# Patient Record
Sex: Female | Born: 1950 | Race: Black or African American | Hispanic: No | Marital: Married | Smoking: Former smoker
Health system: Southern US, Community
[De-identification: ages and names within clinical notes are randomized; demographics above are authoritative.]

## PROBLEM LIST (undated history)

## (undated) DIAGNOSIS — Z8679 Personal history of other diseases of the circulatory system: Secondary | ICD-10-CM

## (undated) DIAGNOSIS — R011 Cardiac murmur, unspecified: Secondary | ICD-10-CM

## (undated) DIAGNOSIS — E7800 Pure hypercholesterolemia, unspecified: Secondary | ICD-10-CM

## (undated) DIAGNOSIS — J40 Bronchitis, not specified as acute or chronic: Secondary | ICD-10-CM

## (undated) DIAGNOSIS — D72819 Decreased white blood cell count, unspecified: Secondary | ICD-10-CM

## (undated) DIAGNOSIS — M546 Pain in thoracic spine: Secondary | ICD-10-CM

## (undated) DIAGNOSIS — K5900 Constipation, unspecified: Secondary | ICD-10-CM

## (undated) DIAGNOSIS — R252 Cramp and spasm: Secondary | ICD-10-CM

## (undated) DIAGNOSIS — M479 Spondylosis, unspecified: Secondary | ICD-10-CM

## (undated) DIAGNOSIS — Z8701 Personal history of pneumonia (recurrent): Secondary | ICD-10-CM

## (undated) HISTORY — DX: Spondylosis, unspecified: M47.9

## (undated) HISTORY — DX: Cardiac murmur, unspecified: R01.1

## (undated) HISTORY — DX: Pure hypercholesterolemia, unspecified: E78.00

## (undated) HISTORY — DX: Personal history of other diseases of the circulatory system: Z86.79

## (undated) HISTORY — DX: Personal history of pneumonia (recurrent): Z87.01

## (undated) HISTORY — DX: Pain in thoracic spine: M54.6

## (undated) HISTORY — DX: Constipation, unspecified: K59.00

## (undated) HISTORY — DX: Cramp and spasm: R25.2

## (undated) HISTORY — DX: Decreased white blood cell count, unspecified: D72.819

## (undated) HISTORY — DX: Bronchitis, not specified as acute or chronic: J40

---

## 2001-01-04 ENCOUNTER — Ambulatory Visit (HOSPITAL_COMMUNITY): Admission: RE | Admit: 2001-01-04 | Discharge: 2001-01-04 | Payer: Self-pay | Admitting: Internal Medicine

## 2001-02-01 ENCOUNTER — Other Ambulatory Visit: Admission: RE | Admit: 2001-02-01 | Discharge: 2001-02-01 | Payer: Self-pay | Admitting: Obstetrics & Gynecology

## 2001-07-04 ENCOUNTER — Encounter: Payer: Self-pay | Admitting: Emergency Medicine

## 2001-07-04 ENCOUNTER — Emergency Department (HOSPITAL_COMMUNITY): Admission: EM | Admit: 2001-07-04 | Discharge: 2001-07-05 | Payer: Self-pay | Admitting: Emergency Medicine

## 2002-06-25 ENCOUNTER — Other Ambulatory Visit: Admission: RE | Admit: 2002-06-25 | Discharge: 2002-06-25 | Payer: Self-pay | Admitting: Obstetrics & Gynecology

## 2002-08-18 ENCOUNTER — Emergency Department (HOSPITAL_COMMUNITY): Admission: EM | Admit: 2002-08-18 | Discharge: 2002-08-18 | Payer: Self-pay | Admitting: *Deleted

## 2003-07-28 ENCOUNTER — Other Ambulatory Visit: Admission: RE | Admit: 2003-07-28 | Discharge: 2003-07-28 | Payer: Self-pay | Admitting: Obstetrics & Gynecology

## 2003-09-28 ENCOUNTER — Other Ambulatory Visit: Admission: RE | Admit: 2003-09-28 | Discharge: 2003-09-28 | Payer: Self-pay | Admitting: Obstetrics & Gynecology

## 2004-08-26 ENCOUNTER — Ambulatory Visit (HOSPITAL_COMMUNITY): Admission: RE | Admit: 2004-08-26 | Discharge: 2004-08-26 | Payer: Self-pay | Admitting: Internal Medicine

## 2007-02-02 ENCOUNTER — Ambulatory Visit (HOSPITAL_COMMUNITY): Admission: RE | Admit: 2007-02-02 | Discharge: 2007-02-02 | Payer: Self-pay | Admitting: Family Medicine

## 2007-02-09 ENCOUNTER — Ambulatory Visit (HOSPITAL_COMMUNITY): Admission: RE | Admit: 2007-02-09 | Discharge: 2007-02-09 | Payer: Self-pay | Admitting: Family Medicine

## 2007-03-14 ENCOUNTER — Encounter (HOSPITAL_COMMUNITY): Admission: RE | Admit: 2007-03-14 | Discharge: 2007-04-19 | Payer: Self-pay | Admitting: Neurosurgery

## 2007-06-04 ENCOUNTER — Emergency Department (HOSPITAL_COMMUNITY): Admission: EM | Admit: 2007-06-04 | Discharge: 2007-06-04 | Payer: Self-pay | Admitting: Emergency Medicine

## 2007-12-20 ENCOUNTER — Emergency Department (HOSPITAL_COMMUNITY): Admission: EM | Admit: 2007-12-20 | Discharge: 2007-12-20 | Payer: Self-pay | Admitting: Emergency Medicine

## 2008-09-04 ENCOUNTER — Other Ambulatory Visit: Admission: RE | Admit: 2008-09-04 | Discharge: 2008-09-04 | Payer: Self-pay | Admitting: Obstetrics and Gynecology

## 2008-09-08 ENCOUNTER — Ambulatory Visit (HOSPITAL_COMMUNITY): Admission: RE | Admit: 2008-09-08 | Discharge: 2008-09-08 | Payer: Self-pay | Admitting: Obstetrics & Gynecology

## 2008-09-17 ENCOUNTER — Ambulatory Visit (HOSPITAL_COMMUNITY): Admission: RE | Admit: 2008-09-17 | Discharge: 2008-09-17 | Payer: Self-pay | Admitting: Obstetrics & Gynecology

## 2009-04-29 HISTORY — PX: COLONOSCOPY W/ BIOPSIES AND POLYPECTOMY: SHX1376

## 2009-05-20 ENCOUNTER — Ambulatory Visit: Payer: Self-pay | Admitting: Gastroenterology

## 2009-05-21 ENCOUNTER — Encounter: Payer: Self-pay | Admitting: Gastroenterology

## 2009-05-26 ENCOUNTER — Ambulatory Visit (HOSPITAL_COMMUNITY): Admission: RE | Admit: 2009-05-26 | Discharge: 2009-05-26 | Payer: Self-pay | Admitting: Gastroenterology

## 2009-05-26 ENCOUNTER — Ambulatory Visit: Payer: Self-pay | Admitting: Gastroenterology

## 2009-05-26 ENCOUNTER — Encounter: Payer: Self-pay | Admitting: Gastroenterology

## 2009-10-18 ENCOUNTER — Ambulatory Visit: Payer: Self-pay | Admitting: Cardiology

## 2009-10-18 ENCOUNTER — Inpatient Hospital Stay (HOSPITAL_COMMUNITY): Admission: EM | Admit: 2009-10-18 | Discharge: 2009-10-19 | Payer: Self-pay | Admitting: Emergency Medicine

## 2009-10-27 ENCOUNTER — Ambulatory Visit: Payer: Self-pay | Admitting: Cardiology

## 2009-10-27 ENCOUNTER — Ambulatory Visit (HOSPITAL_COMMUNITY): Admission: RE | Admit: 2009-10-27 | Discharge: 2009-10-27 | Payer: Self-pay | Admitting: Cardiology

## 2009-10-27 ENCOUNTER — Encounter: Payer: Self-pay | Admitting: Cardiology

## 2009-11-05 ENCOUNTER — Ambulatory Visit (HOSPITAL_COMMUNITY): Admission: RE | Admit: 2009-11-05 | Discharge: 2009-11-05 | Payer: Self-pay | Admitting: Obstetrics & Gynecology

## 2009-12-08 ENCOUNTER — Encounter (HOSPITAL_COMMUNITY): Admission: RE | Admit: 2009-12-08 | Discharge: 2010-01-07 | Payer: Self-pay | Admitting: Oncology

## 2009-12-08 ENCOUNTER — Ambulatory Visit (HOSPITAL_COMMUNITY): Payer: Self-pay | Admitting: Oncology

## 2009-12-09 ENCOUNTER — Other Ambulatory Visit: Admission: RE | Admit: 2009-12-09 | Discharge: 2009-12-09 | Payer: Self-pay | Admitting: Obstetrics and Gynecology
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
## 2010-03-12 ENCOUNTER — Ambulatory Visit (HOSPITAL_COMMUNITY): Payer: Self-pay | Admitting: Oncology

## 2010-03-12 ENCOUNTER — Encounter (HOSPITAL_COMMUNITY): Admission: RE | Admit: 2010-03-12 | Discharge: 2010-04-11 | Payer: Self-pay | Admitting: Oncology

## 2010-06-11 ENCOUNTER — Encounter (HOSPITAL_COMMUNITY)
Admission: RE | Admit: 2010-06-11 | Discharge: 2010-07-11 | Payer: Self-pay | Source: Home / Self Care | Admitting: Oncology

## 2010-06-11 ENCOUNTER — Ambulatory Visit (HOSPITAL_COMMUNITY): Payer: Self-pay | Admitting: Oncology

## 2010-09-19 ENCOUNTER — Encounter (HOSPITAL_COMMUNITY): Payer: Self-pay | Admitting: Oncology

## 2010-11-10 LAB — CBC
HCT: 38.7 % (ref 36.0–46.0)
Hemoglobin: 12.9 g/dL (ref 12.0–15.0)
MCV: 86.4 fL (ref 78.0–100.0)
WBC: 3.9 10*3/uL — ABNORMAL LOW (ref 4.0–10.5)

## 2010-11-10 LAB — DIFFERENTIAL
Basophils Relative: 1 % (ref 0–1)
Eosinophils Absolute: 0.1 10*3/uL (ref 0.0–0.7)
Eosinophils Relative: 3 % (ref 0–5)
Lymphs Abs: 2.5 10*3/uL (ref 0.7–4.0)
Monocytes Absolute: 0.4 10*3/uL (ref 0.1–1.0)
Monocytes Relative: 10 % (ref 3–12)
Neutro Abs: 0.8 10*3/uL — ABNORMAL LOW (ref 1.7–7.7)

## 2010-11-13 LAB — DIFFERENTIAL
Eosinophils Relative: 2 % (ref 0–5)
Lymphocytes Relative: 65 % — ABNORMAL HIGH (ref 12–46)
Monocytes Absolute: 0.3 10*3/uL (ref 0.1–1.0)
Monocytes Relative: 10 % (ref 3–12)
Neutro Abs: 0.7 10*3/uL — ABNORMAL LOW (ref 1.7–7.7)
Neutrophils Relative %: 22 % — ABNORMAL LOW (ref 43–77)

## 2010-11-13 LAB — CBC
HCT: 40.4 % (ref 36.0–46.0)
MCHC: 33.3 g/dL (ref 30.0–36.0)
Platelets: 227 10*3/uL (ref 150–400)
WBC: 3 10*3/uL — ABNORMAL LOW (ref 4.0–10.5)

## 2010-11-17 LAB — CARDIAC PANEL(CRET KIN+CKTOT+MB+TROPI)
CK, MB: 3.2 ng/mL (ref 0.3–4.0)
Relative Index: 2.6 — ABNORMAL HIGH (ref 0.0–2.5)
Relative Index: 2.8 — ABNORMAL HIGH (ref 0.0–2.5)
Troponin I: 0.01 ng/mL (ref 0.00–0.06)

## 2010-11-17 LAB — CBC
HCT: 36.2 % (ref 36.0–46.0)
HCT: 41.9 % (ref 36.0–46.0)
HCT: 39.1 % (ref 36.0–46.0)
Hemoglobin: 12 g/dL (ref 12.0–15.0)
Hemoglobin: 13.3 g/dL (ref 12.0–15.0)
MCV: 87.6 fL (ref 78.0–100.0)
MCV: 85.9 fL (ref 78.0–100.0)
Platelets: 211 10*3/uL (ref 150–400)
Platelets: 229 10*3/uL (ref 150–400)
Platelets: 226 10*3/uL (ref 150–400)
RBC: 4.13 MIL/uL (ref 3.87–5.11)
RDW: 13.4 % (ref 11.5–15.5)
WBC: 4.5 10*3/uL (ref 4.0–10.5)
WBC: 5.6 10*3/uL (ref 4.0–10.5)

## 2010-11-17 LAB — DIFFERENTIAL
Basophils Absolute: 0 10*3/uL (ref 0.0–0.1)
Basophils Relative: 1 % (ref 0–1)
Basophils Relative: 1 % (ref 0–1)
Eosinophils Absolute: 0.1 10*3/uL (ref 0.0–0.7)
Eosinophils Absolute: 0.1 10*3/uL (ref 0.0–0.7)
Eosinophils Relative: 2 % (ref 0–5)
Eosinophils Relative: 2 % (ref 0–5)
Lymphocytes Relative: 55 % — ABNORMAL HIGH (ref 12–46)
Lymphocytes Relative: 62 % — ABNORMAL HIGH (ref 12–46)
Lymphs Abs: 3.1 10*3/uL (ref 0.7–4.0)
Monocytes Absolute: 0.4 10*3/uL (ref 0.1–1.0)
Monocytes Absolute: 0.5 10*3/uL (ref 0.1–1.0)
Monocytes Relative: 10 % (ref 3–12)
Monocytes Relative: 7 % (ref 3–12)
Neutro Abs: 0.8 10*3/uL — ABNORMAL LOW (ref 1.7–7.7)
Neutro Abs: 1 10*3/uL — ABNORMAL LOW (ref 1.7–7.7)
Neutrophils Relative %: 26 % — ABNORMAL LOW (ref 43–77)

## 2010-11-17 LAB — BASIC METABOLIC PANEL
BUN: 17 mg/dL (ref 6–23)
BUN: 18 mg/dL (ref 6–23)
CO2: 25 mEq/L (ref 19–32)
Chloride: 107 mEq/L (ref 96–112)
Creatinine, Ser: 0.71 mg/dL (ref 0.4–1.2)
GFR calc Af Amer: >60 mL/min (ref >60)
GFR calc non Af Amer: >60 mL/min (ref >60)
Potassium: 3.6 mEq/L (ref 3.5–5.1)
Potassium: 4.1 mEq/L (ref 3.5–5.1)
Sodium: 136 mEq/L (ref 135–145)
Sodium: 141 mEq/L (ref 135–145)

## 2010-11-17 LAB — HEPATIC FUNCTION PANEL
ALT: 22 U/L (ref 0–35)
AST: 21 U/L (ref 0–37)
Albumin: 3.8 g/dL (ref 3.5–5.2)
Bilirubin, Direct: 0.1 mg/dL (ref 0.0–0.3)
Indirect Bilirubin: 0.5 mg/dL (ref 0.3–0.9)
Total Bilirubin: 0.6 mg/dL (ref 0.3–1.2)

## 2010-11-17 LAB — POCT CARDIAC MARKERS
CKMB, poc: 2 ng/mL (ref 1.0–8.0)
Troponin i, poc: <0.05 ng/mL (ref 0.00–0.09)

## 2010-11-17 LAB — ANA: Anti Nuclear Antibody(ANA): NEGATIVE

## 2010-11-17 LAB — VITAMIN B12: Vitamin B-12: 1184 pg/mL — ABNORMAL HIGH (ref 211–911)

## 2010-11-17 LAB — PROTIME-INR: INR: 1.08 (ref 0.00–1.49)

## 2010-11-17 LAB — RHEUMATOID FACTOR: Rhuematoid fact SerPl-aCnc: <20 IU/mL (ref 0–20)

## 2010-11-17 LAB — COMPREHENSIVE METABOLIC PANEL
Alkaline Phosphatase: 67 U/L (ref 39–117)
BUN: 15 mg/dL (ref 6–23)
Creatinine, Ser: 0.69 mg/dL (ref 0.4–1.2)
Glucose, Bld: 85 mg/dL (ref 70–99)
Potassium: 3.8 mEq/L (ref 3.5–5.1)
Total Bilirubin: 0.3 mg/dL (ref 0.3–1.2)
Total Protein: 7.1 g/dL (ref 6.0–8.3)

## 2010-11-17 LAB — B. BURGDORFI ANTIBODIES: B burgdorferi Ab IgG+IgM: 0.07 {ISR}

## 2010-12-16 ENCOUNTER — Other Ambulatory Visit: Payer: Self-pay | Admitting: Obstetrics & Gynecology

## 2010-12-16 DIAGNOSIS — Z139 Encounter for screening, unspecified: Secondary | ICD-10-CM

## 2010-12-23 ENCOUNTER — Ambulatory Visit (HOSPITAL_COMMUNITY)
Admission: RE | Admit: 2010-12-23 | Discharge: 2010-12-23 | Disposition: A | Discharge status: Home / Self Care | Payer: BC Managed Care – PPO | Source: Ambulatory Visit | Attending: Obstetrics & Gynecology | Admitting: Obstetrics & Gynecology

## 2010-12-23 ENCOUNTER — Ambulatory Visit (HOSPITAL_COMMUNITY): Payer: Self-pay

## 2010-12-23 DIAGNOSIS — Z1231 Encounter for screening mammogram for malignant neoplasm of breast: Secondary | ICD-10-CM | POA: Insufficient documentation

## 2010-12-23 DIAGNOSIS — Z139 Encounter for screening, unspecified: Secondary | ICD-10-CM

## 2011-01-24 ENCOUNTER — Emergency Department (HOSPITAL_COMMUNITY)
Admission: EM | Admit: 2011-01-24 | Discharge: 2011-01-24 | Disposition: A | Discharge status: Home / Self Care | Payer: BC Managed Care – PPO | Attending: Emergency Medicine | Admitting: Emergency Medicine

## 2011-01-24 DIAGNOSIS — L0100 Impetigo, unspecified: Secondary | ICD-10-CM | POA: Insufficient documentation

## 2011-01-24 DIAGNOSIS — E7800 Pure hypercholesterolemia, unspecified: Secondary | ICD-10-CM | POA: Insufficient documentation

## 2011-01-24 DIAGNOSIS — R51 Headache: Secondary | ICD-10-CM | POA: Insufficient documentation

## 2011-06-02 ENCOUNTER — Other Ambulatory Visit: Payer: Self-pay | Admitting: Adult Health

## 2011-06-02 ENCOUNTER — Other Ambulatory Visit (HOSPITAL_COMMUNITY)
Admission: RE | Admit: 2011-06-02 | Discharge: 2011-06-02 | Disposition: A | Discharge status: Home / Self Care | Payer: BC Managed Care – PPO | Source: Ambulatory Visit | Attending: Obstetrics and Gynecology | Admitting: Obstetrics and Gynecology

## 2011-06-02 DIAGNOSIS — Z01419 Encounter for gynecological examination (general) (routine) without abnormal findings: Secondary | ICD-10-CM | POA: Insufficient documentation

## 2011-06-10 ENCOUNTER — Encounter (HOSPITAL_COMMUNITY): Payer: BC Managed Care – PPO | Attending: Oncology

## 2011-06-10 DIAGNOSIS — D72819 Decreased white blood cell count, unspecified: Secondary | ICD-10-CM

## 2011-06-10 LAB — DIFFERENTIAL
Basophils Absolute: 0 10*3/uL (ref 0.0–0.1)
Eosinophils Absolute: 0.1 10*3/uL (ref 0.0–0.7)
Lymphs Abs: 2.7 10*3/uL (ref 0.7–4.0)
Monocytes Absolute: 0.4 10*3/uL (ref 0.1–1.0)

## 2011-06-10 LAB — CBC
MCH: 28.2 pg (ref 26.0–34.0)
MCHC: 32.9 g/dL (ref 30.0–36.0)
MCV: 85.8 fL (ref 78.0–100.0)
Platelets: 262 10*3/uL (ref 150–400)
RDW: 13.9 % (ref 11.5–15.5)

## 2011-06-10 NOTE — Progress Notes (Signed)
Labs drawn today for cbc,diff 

## 2011-06-13 ENCOUNTER — Encounter (HOSPITAL_BASED_OUTPATIENT_CLINIC_OR_DEPARTMENT_OTHER): Payer: BC Managed Care – PPO

## 2011-06-13 ENCOUNTER — Encounter (HOSPITAL_COMMUNITY): Payer: Self-pay

## 2011-06-13 VITALS — BP 146/85 | HR 78 | Temp 98.4°F | Wt 182.6 lb

## 2011-06-13 DIAGNOSIS — D72819 Decreased white blood cell count, unspecified: Secondary | ICD-10-CM

## 2011-06-13 NOTE — Patient Instructions (Signed)
[facility]  Discharge Instructions  RECOMMENDATIONS MADE BY THE CONSULTANT AND ANY TEST RESULTS WILL BE SENT TO YOUR REFERRING DOCTOR.   EXAM FINDINGS BY MD TODAY AND SIGNS AND SYMPTOMS TO REPORT TO CLINIC OR PRIMARY MD: exam per Dr. Dalene Carrow   INSTRUCTIONS GIVEN AND DISCUSSED: Other  Labs and exam in one year  SPECIAL INSTRUCTIONS/FOLLOW-UP: Other (Referral/Appointments) one year   I acknowledge that I have been informed and understand all the instructions given to me and received a copy. I do not have any more questions at this time, but understand that I may call the Specialty Clinic at [facility] at [phone] during business hours should I have any further questions or need assistance in obtaining follow-up care.    __________________________________________  _____________  __________ Signature of Patient or Authorized Representative            Date                   Time    __________________________________________ Nurse's Signature

## 2011-06-13 NOTE — Progress Notes (Signed)
This office note has been dictated.

## 2011-07-11 NOTE — Progress Notes (Signed)
CC:   Karen Huynh, M.D.  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  INTERVAL HISTORY:  Karen Huynh was last seen a year ago.  Since that time, does not report any upper respiratory tract infections, pneumonias, or skin boils.  She is up-to-date with flu shot.  She works at a nursing home.  Her weight is stable.  She has not noted any adenopathy.  Besides perimenopausal night sweats, denies fever or chills.  ALLERGIES:  None.  MEDICATIONS:  Are Wellbutrin.  REVIEW OF SYSTEMS:  As in nursing intake sheet and essentially negative.  PHYSICAL EXAMINATION:  General:  The patient is a well-appearing, well- nourished woman in no distress.  Vitals:  Pulse 78, blood pressure 146/85, temperature 98.4, respirations 20, weight 189.2 pounds.  HEENT: Head is atraumatic, normocephalic.  Sclerae anicteric.  Mouth moist. Neck:  Supple.  Chest:  Clear.  CVS:  Unremarkable.  Abdomen:  Soft, nontender.  Bowel sounds present.  Extremities:  No edema.  No calf tenderness.  Lymph Nodes:  No adenopathy.  LABORATORY DATA:  CBC obtained 06/10/2011:  White cell count e.7, hemoglobin 13.9, hematocrit 42.3, platelets 262, ANC 500 (800).  IMPRESSION AND PLAN:  Karen Huynh is a pleasant 60 year old woman with a history of mild leukopenia.  Her labs remain stable.  She also has remained clinically stable with no signs of infections.  Thus with this said, we will have her return in a year's time with repeat CBC.  In the interim, if she has any issues or concerns, she is not to hesitate to contact us.    ______________________________ Laurice Record, M.D. LIO/MEDQ  D:  06/13/2011  T:  06/13/2011  Job:  [id number]

## 2012-01-12 ENCOUNTER — Other Ambulatory Visit: Payer: Self-pay | Admitting: Adult Health

## 2012-01-12 DIAGNOSIS — Z139 Encounter for screening, unspecified: Secondary | ICD-10-CM

## 2012-01-19 ENCOUNTER — Ambulatory Visit (HOSPITAL_COMMUNITY)
Admission: RE | Admit: 2012-01-19 | Discharge: 2012-01-19 | Disposition: A | Discharge status: Home / Self Care | Payer: BC Managed Care – PPO | Source: Ambulatory Visit | Attending: Adult Health | Admitting: Adult Health

## 2012-01-19 DIAGNOSIS — Z139 Encounter for screening, unspecified: Secondary | ICD-10-CM

## 2012-01-19 DIAGNOSIS — Z1231 Encounter for screening mammogram for malignant neoplasm of breast: Secondary | ICD-10-CM | POA: Insufficient documentation

## 2012-06-05 ENCOUNTER — Other Ambulatory Visit (HOSPITAL_COMMUNITY)
Admission: RE | Admit: 2012-06-05 | Discharge: 2012-06-05 | Disposition: A | Discharge status: Home / Self Care | Payer: BC Managed Care – PPO | Source: Ambulatory Visit | Attending: Obstetrics and Gynecology | Admitting: Obstetrics and Gynecology

## 2012-06-05 DIAGNOSIS — Z1151 Encounter for screening for human papillomavirus (HPV): Secondary | ICD-10-CM | POA: Insufficient documentation

## 2012-06-05 DIAGNOSIS — Z01419 Encounter for gynecological examination (general) (routine) without abnormal findings: Secondary | ICD-10-CM | POA: Insufficient documentation

## 2012-06-11 ENCOUNTER — Other Ambulatory Visit (HOSPITAL_COMMUNITY): Payer: BC Managed Care – PPO

## 2012-06-13 ENCOUNTER — Encounter (HOSPITAL_COMMUNITY): Payer: BC Managed Care – PPO | Attending: Oncology

## 2012-06-13 DIAGNOSIS — D72819 Decreased white blood cell count, unspecified: Secondary | ICD-10-CM | POA: Insufficient documentation

## 2012-06-13 DIAGNOSIS — F17200 Nicotine dependence, unspecified, uncomplicated: Secondary | ICD-10-CM | POA: Insufficient documentation

## 2012-06-13 LAB — CBC
HCT: 41 % (ref 36.0–46.0)
MCHC: 32.7 g/dL (ref 30.0–36.0)
Platelets: 249 10*3/uL (ref 150–400)
RDW: 13.8 % (ref 11.5–15.5)

## 2012-06-13 LAB — DIFFERENTIAL
Basophils Absolute: 0 10*3/uL (ref 0.0–0.1)
Basophils Relative: 1 % (ref 0–1)
Monocytes Absolute: 0.4 10*3/uL (ref 0.1–1.0)
Neutro Abs: 0.5 10*3/uL — ABNORMAL LOW (ref 1.7–7.7)
Neutrophils Relative %: 14 % — ABNORMAL LOW (ref 43–77)

## 2012-06-13 NOTE — Progress Notes (Signed)
Labs drawn today for cbc/diff 

## 2012-06-18 ENCOUNTER — Encounter (HOSPITAL_BASED_OUTPATIENT_CLINIC_OR_DEPARTMENT_OTHER): Payer: BC Managed Care – PPO | Admitting: Oncology

## 2012-06-18 ENCOUNTER — Encounter (HOSPITAL_COMMUNITY): Payer: Self-pay | Admitting: Oncology

## 2012-06-18 VITALS — BP 137/80 | HR 70 | Temp 98.0°F | Resp 16 | Wt 183.6 lb

## 2012-06-18 DIAGNOSIS — F17200 Nicotine dependence, unspecified, uncomplicated: Secondary | ICD-10-CM

## 2012-06-18 DIAGNOSIS — D72819 Decreased white blood cell count, unspecified: Secondary | ICD-10-CM

## 2012-06-18 NOTE — Progress Notes (Signed)
Problem #1 leukopenia times several years. She has had no change in her white cell count. Her only 2 normal white counts however were at a time when she was admitted with a possible diagnosis of pericarditis in 2011. She has no increased use of antibiotics, frequency of sinusitis or bronchitis or pneumonia etc. Unfortunately she still smoking about 4 cigarettes per day and I have asked her one more time to these quit. The most she has smoked is one half pack of cigarettes a day starting around age 6. She has no other symptoms of vasculitis. Her workup in the past for this leukopenia is negative. 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 Her review of systems is otherwise noncontributory  She is up-to-date on her mammography and her colonoscopy. Her mother did die of colon cancer  Vital signs are stable. Lungs are clear. She has no lymphadenopathy. She has no hepatosplenomegaly. Heart shows a regular rhythm and rate without murmur rub or gallop. Bowel sounds are normal. She has no leg edema arm edema and no skin lesions.  She looks very good we'll see her back in one year. I will consider checking an ANCA antibody test that. I suspect it will be negative.

## 2012-06-18 NOTE — Patient Instructions (Addendum)
[facility]  Discharge Instructions  RECOMMENDATIONS MADE BY THE CONSULTANT AND ANY TEST RESULTS WILL BE SENT TO YOUR REFERRING DOCTOR.   EXAM FINDINGS BY MD TODAY AND SIGNS AND SYMPTOMS TO REPORT TO CLINIC OR PRIMARY MD: exam and discussion by MD.  Your blood work is stable.  We will check a special blood test when you come back in 1 year.  Try to stop smoking.  MEDICATIONS PRESCRIBED: none   INSTRUCTIONS GIVEN AND DISCUSSED: Other :  Report any recurring infections,night sweats, etc.  SPECIAL INSTRUCTIONS/FOLLOW-UP: Lab work Needed in 1 year and Return to Clinic after labs in 1 year.   I acknowledge that I have been informed and understand all the instructions given to me and received a copy. I do not have any more questions at this time, but understand that I may call the Specialty Clinic at [facility] at [phone] during business hours should I have any further questions or need assistance in obtaining follow-up care.    __________________________________________  _____________  __________ Signature of Patient or Authorized Representative            Date                   Time    __________________________________________ Nurse's Signature

## 2012-10-30 ENCOUNTER — Other Ambulatory Visit (HOSPITAL_COMMUNITY): Payer: Self-pay | Admitting: Family Medicine

## 2012-10-30 DIAGNOSIS — M5430 Sciatica, unspecified side: Secondary | ICD-10-CM

## 2012-10-30 DIAGNOSIS — Z139 Encounter for screening, unspecified: Secondary | ICD-10-CM

## 2012-11-05 ENCOUNTER — Ambulatory Visit (HOSPITAL_COMMUNITY)
Admission: RE | Admit: 2012-11-05 | Discharge: 2012-11-05 | Disposition: A | Discharge status: Home / Self Care | Payer: BC Managed Care – PPO | Source: Ambulatory Visit | Attending: Family Medicine | Admitting: Family Medicine

## 2012-11-05 DIAGNOSIS — M5430 Sciatica, unspecified side: Secondary | ICD-10-CM

## 2012-11-05 DIAGNOSIS — Z139 Encounter for screening, unspecified: Secondary | ICD-10-CM

## 2012-11-05 DIAGNOSIS — Z780 Asymptomatic menopausal state: Secondary | ICD-10-CM | POA: Insufficient documentation

## 2012-11-05 DIAGNOSIS — M899 Disorder of bone, unspecified: Secondary | ICD-10-CM | POA: Insufficient documentation

## 2012-11-27 ENCOUNTER — Ambulatory Visit (HOSPITAL_COMMUNITY)
Admission: RE | Admit: 2012-11-27 | Discharge: 2012-11-27 | Disposition: A | Discharge status: Home / Self Care | Payer: BC Managed Care – PPO | Source: Ambulatory Visit | Attending: Family Medicine | Admitting: Family Medicine

## 2012-11-27 DIAGNOSIS — M545 Low back pain, unspecified: Secondary | ICD-10-CM | POA: Insufficient documentation

## 2012-11-27 DIAGNOSIS — M546 Pain in thoracic spine: Secondary | ICD-10-CM | POA: Insufficient documentation

## 2012-11-27 DIAGNOSIS — IMO0001 Reserved for inherently not codable concepts without codable children: Secondary | ICD-10-CM | POA: Insufficient documentation

## 2012-11-27 DIAGNOSIS — M6281 Muscle weakness (generalized): Secondary | ICD-10-CM | POA: Insufficient documentation

## 2012-11-27 DIAGNOSIS — M542 Cervicalgia: Secondary | ICD-10-CM | POA: Insufficient documentation

## 2012-11-28 DIAGNOSIS — M542 Cervicalgia: Secondary | ICD-10-CM | POA: Insufficient documentation

## 2012-11-28 DIAGNOSIS — M545 Low back pain: Secondary | ICD-10-CM | POA: Insufficient documentation

## 2012-11-28 NOTE — Evaluation (Addendum)
Physical Therapy Evaluation  Patient Details  Name: GENELLA BAS MRN: [id number] Date of Birth: November 10, 1950  Today's Date: 11/27/2012 Time: 4098-1191 PT Time Calculation (min): 30 min Charges: 1 eval             Visit#: 1 of 12  Re-eval: 12/27/12 Assessment Diagnosis: LBP Next MD Visit: Dr. Regino Schultze - unscheduled  Past Medical History:  Past Medical History  Diagnosis Date  . H/O: rheumatic fever     as child  . History of pneumonia   . Leukopenia    Past Surgical History: No past surgical history on file.  Subjective Symptoms/Limitations Pertinent History: Pt is referred to PT for LBP with radicular symptoms to LLE with neck and shoulder pain which started back in January.  She reports that she has to sit in a recliner all day for her job and states that her back and shoulders begin to be very painful by the end of the day.  Limitations: Sitting;Lifting;Standing;Walking How long can you sit comfortably?: 2 hours  How long can you stand comfortably?: an hour, with her church shoes 30 minutes How long can you walk comfortably?: no difficulty.  Patient Stated Goals: "Get rid of this pain." Pain Assessment Currently in Pain?: Yes Pain Score:   7 Pain Location: Back (shoulder region and low back down left leg) Pain Orientation: Left Pain Type: Acute pain;Chronic pain Pain Onset: More than a month ago Pain Frequency: Constant Pain Relieving Factors: getting up and moving around.  Effect of Pain on Daily Activities: difficulty getting comfortable  Balance Screening Balance Screen Has the patient fallen in the past 6 months: No Has the patient had a decrease in activity level because of a fear of falling? : No Is the patient reluctant to leave their home because of a fear of falling? 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  Requires sitting most of  her day.   Sensation/Coordination/Flexibility/Functional Tests Coordination Gross Motor Movements are Fluid and Coordinated: No Coordination and Movement Description: impaired to transverse, multifidus and pelvic floor.  Functional Tests Functional Tests: Oswestry Disability Index (ODI): 56%  Assessment RLE Strength Right Hip Flexion: 3+/5 Right Hip Extension: 3+/5 Right Hip ABduction: 3+/5 Right Knee Flexion: 4/5 Right Knee Extension: 4/5 LLE Strength Left Hip Flexion: 3+/5 Left Hip Extension: 3+/5 Left Hip ABduction: 3+/5 Left Knee Flexion: 4/5 Left Knee Extension: 4/5 Lumbar AROM Lumbar Flexion: WNL Lumbar Extension: WNL Lumbar - Right Side Bend: WNL Lumbar - Left Side Bend: WNL Palpation Palpation: pain and tenderness to lumbar region.  Significant fascial restriction to cervical region   Mobility/Balance  Posture/Postural Control Posture/Postural Control: Postural limitations Postural Limitations: upper cross and lower cross sydnrome   Exercise/Treatments Standing Other Standing Lumbar Exercises: Back Bends x10 Seated Other Seated Lumbar Exercises: Pelvic Floor 1x10 sec holds, TrA 2x10 sec holds, anterior tile 2x10 sec holds Other Seated Lumbar Exercises: Heel and toe roll outs 2x10 sec holds  Physical Therapy Assessment and Plan PT Assessment and Plan Clinical Impression Statement: Pt is a 62 year old female referred to PT for LBP with following impairments listed below.  At this time educated pt on exercises she can perform while at her job. Pt has moderatly decreased pain, especially after back bends.  Pt will benefit from skilled therapeutic intervention in order to improve on the following deficits: Pain;Decreased coordination;Decreased strength;Increased fascial restricitons;Increased muscle spasms Rehab  Potential: Good PT Frequency: Min 3X/week PT Duration: 4 weeks PT Treatment/Interventions: Gait training;Stair training;Functional mobility  training;Therapeutic activities;Therapeutic exercise;Balance training;Neuromuscular re-education;Patient/family education;Manual techniques;Modalities PT Plan: Continue with core exercises and prone activities to decrease sciatic pain, incorportate nerve glides when able.  Continue to progress to LE strengthening (squats, heel/toe raises) 4 way SLR.     Goals Home Exercise Program Pt will Perform Home Exercise Program: Independently PT Goal: Perform Home Exercise Program - Progress: Goal set today PT Short Term Goals Time to Complete Short Term Goals: 2 weeks PT Short Term Goal 1: Pt will report 75% decrease in Lt leg radicular symptoms.  PT Short Term Goal 2: Pt will improve her core coordination and strength in order to sit with appriororiate posture for 20 mintues.  PT Long Term Goals Time to Complete Long Term Goals: 4 weeks PT Long Term Goal 1: Pt will improve her ODI to less than 25% for improved QOL.  PT Long Term Goal 2: Pt will improve her core strength and activity tolerance to [facility] in order to tolerate sitting for greater than 2 hours for her job duties.  Long Term Goal 3: Pt will present with decreased fascial restrictions to scapular and lower back region in order to improve QOL.  Problem List Patient Active Problem List  Diagnosis  . Lumbago  . Cervicalgia    PT Plan of Care PT Home Exercise Plan: see scanned report PT Patient Instructions: importance of posture, discussed activities while sitting in a char, answered questions on POC.  Consulted and Agree with Plan of Care: Patient   Annett Fabian, PT 11/28/2012, 5:38 PM  Physician Documentation Your signature is required to indicate approval of the treatment plan as stated above.  Please sign and either send electronically or make a copy of this report for your files and return this physician signed original.   Please mark one 1.__approve of plan  2. ___approve of plan with the following  conditions.   ______________________________                                                          _____________________ Physician Signature                                                                                                             Date

## 2012-12-05 ENCOUNTER — Ambulatory Visit (HOSPITAL_COMMUNITY)
Admission: RE | Admit: 2012-12-05 | Discharge: 2012-12-05 | Disposition: A | Discharge status: Home / Self Care | Payer: BC Managed Care – PPO | Source: Ambulatory Visit | Attending: Family Medicine | Admitting: Family Medicine

## 2012-12-05 NOTE — Progress Notes (Signed)
Physical Therapy Treatment Patient Details  Name: Karen Huynh MRN: [id number] Date of Birth: [birth year]  Today's Date: 12/05/2012 Time: 8295-6213 PT Time Calculation (min): 41 min Charge: Therex 38'  Visit#: 2 of 12  Re-eval: 12/27/12   Subjective: Symptoms/Limitations Symptoms: Pt reported cervical, thoracic and lumbar pain scale 8/10 today,  Pt compliant with HEP. Pain Assessment Currently in Pain?: Yes Pain Score:   8 Pain Location: Back Pain Orientation: Upper;Mid;Lower  Objective:   Exercise/Treatments Seated Other Seated Lumbar Exercises: Pelvic Floor 10x10 sec holds, TrA 10x10 sec holds, anterior tile 2x10 sec holds Other Seated Lumbar Exercises: Heel and toe roll outs 10x10 sec holds Supine Straight Leg Raise: 10 reps Sidelying Hip Abduction: 10 reps;Limitations Hip Abduction Limitations: Bil LE Prone  Straight Leg Raise: 10 reps Other Prone Lumbar Exercises: Multifidus 10x 10" with tactile cueing  Physical Therapy Assessment and Plan PT Assessment and Plan Clinical Impression Statement: Progressed core and LE strengthening exercises with tactile cueing for correct musculature activation for multifidus exercise. Pt able to complete therex with min difficulty cueing required for form with prone SLR for correct musculature activation.  Pt reported LBP resolved with multifidus exercise, pt given printout to add exercise to HEP.Marland Kitchen  No c/o radicular pain through session. PT Plan: Continue with core exercises and prone activities to decrease sciatic pain, incorportate nerve glides when able.  Continue to progress to LE strengthening, next session begin squats and heel/toe raises    Goals    Problem List Patient Active Problem List  Diagnosis  . Lumbago  . Cervicalgia    PT - End of Session Activity Tolerance: Patient tolerated treatment well General Behavior During Session: [facility] for tasks performed Cognition: [facility] for tasks performed  GP    Juel Burrow 12/05/2012, 3:49 PM

## 2012-12-07 ENCOUNTER — Ambulatory Visit (HOSPITAL_COMMUNITY)
Admission: RE | Admit: 2012-12-07 | Discharge: 2012-12-07 | Disposition: A | Discharge status: Home / Self Care | Payer: BC Managed Care – PPO | Source: Ambulatory Visit | Attending: Family Medicine | Admitting: Family Medicine

## 2012-12-07 NOTE — Progress Notes (Signed)
Physical Therapy Treatment Patient Details  Name: Karen Huynh MRN: [id number] Date of Birth: 1950-11-12  Today's Date: 12/07/2012 Time: 7829-5621 PT Time Calculation (min): 40 min Charge: Therex 38'  Visit#: 3 of 12  Re-eval: 12/27/12    Subjective: Symptoms/Limitations Symptoms: Pt reported pain 6/10 mid back at beginning of session.  Reported compliance with multifidus and backbend exercises.   Pain Assessment Currently in Pain?: Yes Pain Score:   6 Pain Location: Back Pain Orientation: Mid  Objective:   Exercise/Treatments Standing Heel Raises: 15 reps;Limitations Heel Raises Limitations: toe raises  Functional Squats: 10 reps Seated Other Seated Lumbar Exercises: Pelvic Floor 10x10 sec holds, TrA 10x10 sec holds, anterior tilt 2x10 sec holds Other Seated Lumbar Exercises: Anterior/posterior rotation on green ball Supine Clam: 5 reps Bent Knee Raise: 5 reps Straight Leg Raise: 10 reps Prone  Straight Leg Raise: 15 reps Other Prone Lumbar Exercises: Multifidus 15x 10" with tactile cueing Other Prone Lumbar Exercises: heel squeeze 10x 5"  Physical Therapy Assessment and Plan PT Assessment and Plan Clinical Impression Statement: Pt improving TrA activaiion with less tactile and vc-ing required each rep.  Added squats for LE strengtheing, pt able to verbalize mechnaics for proper positioning with min cueing required.  Pt reported pain resolved at end of session, encouraged to increase frequency of HEP for maximum benefits.   PT Plan: Continue with core exercises and prone activities to decrease sciatic pain, incorportate nerve glides when able.  Continue to progress to LE strengthening.    Goals    Problem List Patient Active Problem List  Diagnosis  . Lumbago  . Cervicalgia    PT - End of Session Activity Tolerance: Patient tolerated treatment well General Behavior During Session: [name] for tasks performed Cognition: [name] for tasks performed  GP     Karen Huynh 12/07/2012, 4:05 PM

## 2012-12-11 ENCOUNTER — Ambulatory Visit (HOSPITAL_COMMUNITY): Payer: BC Managed Care – PPO | Admitting: Physical Therapy

## 2012-12-13 ENCOUNTER — Ambulatory Visit (HOSPITAL_COMMUNITY)
Admission: RE | Admit: 2012-12-13 | Discharge: 2012-12-13 | Disposition: A | Discharge status: Home / Self Care | Payer: BC Managed Care – PPO | Source: Ambulatory Visit | Attending: Family Medicine | Admitting: Family Medicine

## 2012-12-13 NOTE — Progress Notes (Signed)
Physical Therapy Treatment Patient Details  Name: Karen Huynh MRN: [id number] Date of Birth: 18-Feb-1951  Today's Date: 12/13/2012 Time: 4098-1191 PT Time Calculation (min): 38 min Charges: 30' Manual, 8' TE Visit#: 4 of 12  Re-eval: 12/27/12    Subjective: Symptoms/Limitations Symptoms: Pt reports that her lt leg has radicular pain today.  She continues to have neck pain.  States she is able to do some of her exercises during her time at work when she is sitting.  Pain Assessment Currently in Pain?: Yes Pain Score:   6 Pain Location: Back  Precautions/Restrictions     Exercise/Treatments Stretches Upper Trapezius Stretch: 2 reps;30 seconds (Bil) Levator Stretch: 2 reps;30 seconds Supine Bridge: 15 reps  Manual Therapy Manual Therapy: Myofascial release Myofascial Release: Prone: to Lt upper trapezius with soft tissue massage after to decrease pain and fascial restrictions x30 minutes  Physical Therapy Assessment and Plan PT Assessment and Plan Clinical Impression Statement: manual techniques provided to Lt upper trapezius  PT Plan: Focus on cervical pain.  Add UBE, theraband activities next visit    Goals    Problem List Patient Active Problem List  Diagnosis  . Lumbago  . Cervicalgia    PT - End of Session Activity Tolerance: Patient tolerated treatment well General Cognition: WFL for tasks performed PT Plan of Care PT Home Exercise Plan: updated with bridges and cervical stretches PT Patient Instructions: importance of decreasing weight to 12.5lb purse Consulted and Agree with Plan of Care: Patient  GP    Courtne Lighty 12/13/2012, 3:25 PM

## 2012-12-18 ENCOUNTER — Ambulatory Visit (HOSPITAL_COMMUNITY)
Admission: RE | Admit: 2012-12-18 | Discharge: 2012-12-18 | Disposition: A | Discharge status: Home / Self Care | Payer: BC Managed Care – PPO | Source: Ambulatory Visit | Attending: Family Medicine | Admitting: Family Medicine

## 2012-12-18 NOTE — Progress Notes (Signed)
Physical Therapy Treatment Patient Details  Name: Karen Huynh MRN: [id number] Date of Birth: 28-Oct-1950  Today's Date: 12/18/2012 Time: 1478-2956 PT Time Calculation (min): 45 min  Visit#: 5 of 12  Re-eval: 12/27/12 Charges: Therex x 20' Manual x 10' MHP x 10    Subjective: Symptoms/Limitations Symptoms: Pt states that she cleaned out her pocketbook to make it lighter.  Pain Assessment Currently in Pain?: Yes Pain Score:   6 Pain Location: Neck Pain Orientation: Right;Left   Exercise/Treatments Stretches Upper Trapezius Stretch: 2 reps;30 seconds Levator Stretch: 2 reps;30 seconds Seated Exercises Neck Retraction: 10 reps W Back: 10 reps Shoulder Shrugs: 10 reps  Modalities Modalities: Moist Heat Manual Therapy Manual Therapy: Myofascial release Myofascial Release: upper trapezius with soft tissue massage to decrease pain and fascial restrictions Moist Heat Therapy Number Minutes Moist Heat: 10 Minutes Moist Heat Location: Other (comment) (Cervical)  Physical Therapy Assessment and Plan PT Assessment and Plan Clinical Impression Statement: Tx focus on improving posture, ROM and decreasing pain. Manual techniques completed to upper trapezius to decrease pain and tightness. MHP applied at end of session to decrease tightness in cervical area. Pt reports pain decrease to 0/10 at end of session.  PT Plan: Continue to progress postural strength, ROM and decrease pain/tightness per PT POC.     Problem List Patient Active Problem List  Diagnosis  . Lumbago  . Cervicalgia    PT - End of Session Activity Tolerance: Patient tolerated treatment well General Behavior During Therapy: WFL for tasks assessed/performed Cognition: WFL for tasks performed  Seth Bake, PTA  12/18/2012, 3:52 PM

## 2012-12-20 ENCOUNTER — Ambulatory Visit (HOSPITAL_COMMUNITY)
Admission: RE | Admit: 2012-12-20 | Discharge: 2012-12-20 | Disposition: A | Discharge status: Home / Self Care | Payer: BC Managed Care – PPO | Source: Ambulatory Visit | Attending: Family Medicine | Admitting: Family Medicine

## 2012-12-20 NOTE — Progress Notes (Signed)
Physical Therapy Treatment Patient Details  Name: Karen Huynh MRN: [id number] Date of Birth: 03/22/1951  Today's Date: 12/20/2012 Time: 9629-5284 PT Time Calculation (min): 41 min  Visit#: 6 of 12  Re-eval: 12/27/12 Charges: Therex x 28' Manual x 10'   Subjective: Symptoms/Limitations Symptoms: This morining I had an episode with pain going down my L leg but that went away.  I am hurting greater in my left neck than my right but both sides bother me. Pain Assessment Currently in Pain?: Yes Pain Score:   5 Pain Location: Neck Pain Orientation: Right;Left Pain Type: Chronic pain   Exercise/Treatments Theraband Exercises Scapula Retraction: 10 reps;Green Shoulder Extension: 10 reps;Green Rows: 10 reps;Green Standing Exercises Wall Push Ups: 10 reps Upper Extremity Flexion with Stabilization: 10 reps;Flexion Seated Exercises Neck Retraction: 10 reps W Back: 10 reps;Weight W Back Weights (lbs): 2 Shoulder Shrugs: 10 reps  Manual Therapy Myofascial Release: upper trapezius with soft tissue massage to decrease pain and fascial restrictions  Physical Therapy Assessment and Plan PT Assessment and Plan Clinical Impression Statement: Tx focus continues to be on improving posture, ROM and decreasing pain. Pt completes therex well with minimal need for cueing. Manual techniques completed to decrease pain and tightness in cervical musculature. PT Plan: Continue to progress postural strength, ROM and decrease pain/tightness per PT POC.     Problem List Patient Active Problem List  Diagnosis  . Lumbago  . Cervicalgia    PT - End of Session Activity Tolerance: Patient tolerated treatment well General Behavior During Therapy: WFL for tasks assessed/performed Cognition: WFL for tasks performed  Seth Bake, PTA  12/20/2012, 4:19 PM

## 2012-12-25 ENCOUNTER — Ambulatory Visit (HOSPITAL_COMMUNITY)
Admission: RE | Admit: 2012-12-25 | Discharge: 2012-12-25 | Disposition: A | Discharge status: Home / Self Care | Payer: BC Managed Care – PPO | Source: Ambulatory Visit | Attending: Family Medicine | Admitting: Family Medicine

## 2012-12-25 NOTE — Progress Notes (Signed)
Physical Therapy Treatment Patient Details  Name: Karen Huynh MRN: [id number] Date of Birth: 1951-07-16  Today's Date: 12/25/2012 Time: 5784-6962 PT Time Calculation (min): 34 min Charge: therex 23', massage x 10'  Visit#: 7 of 12  Re-eval: 12/27/12    Subjective: Symptoms/Limitations Symptoms: Pt reported upper trap tightness tightness and pain, pain scale 4/10 today  Pain Assessment Currently in Pain?: Yes Pain Score:   4 Pain Location: Neck Pain Orientation: Right;Left  Objective:   Exercise/Treatments Theraband Exercises Scapula Retraction: 10 reps;Green Shoulder Extension: 10 reps;Green Rows: 10 reps;Green Standing Exercises Wall Push Ups: 10 reps Upper Extremity Flexion with Stabilization: 10 reps;Flexion Seated Exercises Neck Retraction: 10 reps X to V: 10 reps W Back: 10 reps;Weight W Back Weights (lbs): 2     Physical Therapy Assessment and Plan PT Assessment and Plan Clinical Impression Statement: Session focus on posture awareness with all therex.  Pt requires cueing to reduce forward head and core musculature activation.  Able to reduce spasms Bil upper trap, able to fully resolve Rt UT spasms but tightness continues for Lt upper traps.  Pt reported pain reduced at end of session. PT Plan: Reassess next session.  Continue to progress postural strength, ROM and decrease pain/tightness per PT POC.    Goals    Problem List Patient Active Problem List   Diagnosis Date Noted  . Lumbago 11/28/2012  . Cervicalgia 11/28/2012    PT - End of Session Activity Tolerance: Patient tolerated treatment well General Behavior During Therapy: WFL for tasks assessed/performed Cognition: WFL for tasks performed  GP    Juel Burrow 12/25/2012, 3:17 PM

## 2012-12-27 ENCOUNTER — Ambulatory Visit (HOSPITAL_COMMUNITY)
Admission: RE | Admit: 2012-12-27 | Discharge: 2012-12-27 | Disposition: A | Discharge status: Home / Self Care | Payer: BC Managed Care – PPO | Source: Ambulatory Visit | Attending: Family Medicine | Admitting: Family Medicine

## 2012-12-27 DIAGNOSIS — IMO0001 Reserved for inherently not codable concepts without codable children: Secondary | ICD-10-CM | POA: Insufficient documentation

## 2012-12-27 DIAGNOSIS — M545 Low back pain, unspecified: Secondary | ICD-10-CM | POA: Insufficient documentation

## 2012-12-27 DIAGNOSIS — M542 Cervicalgia: Secondary | ICD-10-CM | POA: Insufficient documentation

## 2012-12-27 DIAGNOSIS — M6281 Muscle weakness (generalized): Secondary | ICD-10-CM | POA: Insufficient documentation

## 2012-12-27 DIAGNOSIS — M546 Pain in thoracic spine: Secondary | ICD-10-CM | POA: Insufficient documentation

## 2012-12-27 NOTE — Progress Notes (Signed)
Physical Therapy Re-evaluation/treatment  Patient Details  Name: Karen Huynh MRN: [id number] Date of Birth: May 01, 1951  Today's Date: 12/27/2012 Time: 1433-1530 PT Time Calculation (min): 57 min Charge: MMT x 1, Massagel x 10', therex 30', self care x 8'              Visit#: 8 of 12  Re-eval: 01/10/13 Assessment Diagnosis: LBP Next MD Visit: Dr. Regino Schultze - unscheduled  Subjective Symptoms/Limitations Symptoms: Pt reported decreased cervical/upper trap pain, pain scale 2/10 today. How long can you sit comfortably?: 2 hours  How long can you stand comfortably?: stand for 2 hours How long can you walk comfortably?: no difficulty.  Pain Assessment Currently in Pain?: Yes Pain Score:   2 Pain Location: Neck Pain Orientation: Right;Left  Objective:   RLE Strength Right Hip Flexion: 5/5 (was 3+/5) Right Hip Extension: 4/5 (was 3+/5) Right Hip ABduction: 5/5 (4+/5 was 3+/5) Right Knee Flexion:  (4+/5 was 4/5) Right Knee Extension: 5/5 (was 4/53) LLE Strength Left Hip Flexion: 5/5 (was 3+/5) Left Hip Extension: 4/5 (was 3/5) Left Hip ABduction: 5/5 (was 3+/5) Left Knee Flexion:  (4+/5 was 4/5) Left Knee Extension: 5/5 (was 4/5)  Exercise/Treatments Machines for Strengthening UBE (Upper Arm Bike): 6' @ 1.5 postural Theraband Exercises Scapula Retraction: 10 reps;Green Shoulder Extension: 10 reps;Green Rows: 10 reps;Green Standing Exercises Wall Push Ups: 10 reps Upper Extremity Flexion with Stabilization: 10 reps;Flexion Seated Exercises Neck Retraction: 15 reps W Back: 10 reps;Weight W Back Weights (lbs): 2 Shoulder Shrugs: 10 reps Manual Therapy Manual Therapy: Massage Massage: upper trapezius with soft tissue massage to decrease pain and fascial restrictions  Physical Therapy Assessment and Plan PT Assessment and Plan Clinical Impression Statement: Re-eval complete.  Karen Huynh has had 8 OPPT sessions over 4 weeks with the following findings:  Pt reported  independence with some of the HEP exercises daily.  Reported pain scale 2/10 today to upper trap and scapular region.  Pt reports no radicular symptoms for the past 2 weeiks.  Pt with improved LE strengthening bilateral.  Core musculature strengthening is improving though pt continues to require cueing to breath and tactile cueing for current musculature activatio.  Pt now has ability to sit with appropriate posture with min cueing to reduce forward head for 20 minutes comfortably.  Pt with improved score on Oswestry LBP scale for imporoved QOL.  Pt continues to present with fascial restrictions to scapular and upper trap regionss, less tension noted following manual soft tissue massage and MFR to region.   Frequency: 2x/week Duration: 2 weeks PT Plan: Recommend continuing OPPT x 2 more weeks towards goals.    Goals Home Exercise Program Pt will Perform Home Exercise Program: Independently: Met (daily) PT Short Term Goals Time to Complete Short Term Goals: 2 weeks PT Short Term Goal 1: Pt will report 75% decrease in Lt leg radicular symptoms. : Met PT Short Term Goal 2: Pt will improve her core coordination and strength in order to sit with appriororiate posture for 20 mintues. : Progressing toward goal (min cueing to reduce forward head) PT Long Term Goals: 6 weeks PT Long Term Goal 1: Pt will improve her ODI to less than 25% for improved QOL.: Met (22%) PT Long Term Goal 2: Pt will improve her core strength and activity tolerance to [facility] in order to tolerate sitting for greater than 2 hours for her job duties.: Progressing toward goal Long Term Goal 3: Pt will present with decreased fascial restrictions to scapular and lower  back region in order to improve QOL.: Progressing toward goal  Problem List Patient Active Problem List   Diagnosis Date Noted  . Lumbago 11/28/2012  . Cervicalgia 11/28/2012    PT - End of Session Activity Tolerance: Patient tolerated treatment well General Behavior  During Therapy: [facility] for tasks assessed/performed Cognition: WFL for tasks performed  Juel Burrow 12/27/2012, 6:38 PM

## 2013-01-02 ENCOUNTER — Ambulatory Visit (HOSPITAL_COMMUNITY)
Admission: RE | Admit: 2013-01-02 | Discharge: 2013-01-02 | Disposition: A | Discharge status: Home / Self Care | Payer: BC Managed Care – PPO | Source: Ambulatory Visit | Attending: Family Medicine | Admitting: Family Medicine

## 2013-01-02 NOTE — Progress Notes (Signed)
Physical Therapy Treatment Patient Details  Name: Karen Huynh MRN: [id number] Date of Birth: 09/24/1950  Today's Date: 01/02/2013 Time: 1450-1515 PT Time Calculation (min): 25 min  Visit#: 9 of 12  Re-eval: 01/10/13 Charges: Therex x 12' Manual x 12'   Subjective: Symptoms/Limitations Symptoms: Pt reprots no pain only tightness. Pain Assessment Currently in Pain?: No/denies   Exercise/Treatments Machines for Strengthening UBE (Upper Arm Bike): 6' @ 1.5 postural Theraband Exercises Scapula Retraction: 10 reps;Blue Shoulder Extension: 10 reps;Blue Rows: 10 reps;Blue   Manual Therapy Myofascial Release: To upper trapezius with Hawk Grip tools to decrease pain and fascial restrictions  Physical Therapy Assessment and Plan PT Assessment and Plan Clinical Impression Statement: Tx limited by time. Tx focus on decrease tightness. Pt completes postural tband exercises well with minimal need for cueing. MFR completed to cervical region with hawk grip tools. Pt reports decreased tightness at end of session.  PT Plan: Continue to progress postural strength and decrease tightness per PT POC. Issue postural tband exercises for HEP next session.    Goals    Problem List Patient Active Problem List   Diagnosis Date Noted  . Lumbago 11/28/2012  . Cervicalgia 11/28/2012    PT - End of Session Activity Tolerance: Patient tolerated treatment well General Behavior During Therapy: [facility] for tasks assessed/performed Cognition: WFL for tasks performed  Seth Bake, PTA  01/02/2013, 4:49 PM

## 2013-01-04 ENCOUNTER — Ambulatory Visit (HOSPITAL_COMMUNITY)
Admission: RE | Admit: 2013-01-04 | Discharge: 2013-01-04 | Disposition: A | Discharge status: Home / Self Care | Payer: BC Managed Care – PPO | Source: Ambulatory Visit | Attending: Family Medicine | Admitting: Family Medicine

## 2013-01-04 NOTE — Progress Notes (Signed)
Physical Therapy Treatment Patient Details  Name: Karen Huynh MRN: [id number] Date of Birth: 1950/12/29  Today's Date: 01/04/2013 Time: 7846-9629 PT Time Calculation (min): 45 min Charges: 30' TE, 15' Manual  Visit#: 10 of 12  Re-eval: 01/10/13 Assessment Diagnosis: LBP Next MD Visit: Dr. Regino Schultze - unscheduled  Subjective: Symptoms/Limitations Symptoms: Pt reports that she is trying her best at work to sit with good posture.  States it is difficult, but she is very aware of how she is sitting.  Still states stiffness in her neck. Overall low back has improved significantly.   Pain Assessment Currently in Pain?: Yes Pain Score:   2 Pain Location: Neck (stiffness)  Exercise/Treatments Machines for Strengthening UBE (Upper Arm Bike): 6' @ 1.5 postural Theraband Exercises Shoulder Extension: 15 reps;Green Rows: 15 reps;Green Standing Exercises Wall Push Ups: 15 reps Upper Extremity Flexion with Stabilization: 15 reps;Flexion Prone Exercises W Back: 5 reps Shoulder Extension: 10 reps Rows: 10 reps Upper Extremity Flexion with Stabilization: 5 reps Other Prone Exercise: Prone on Elbows: x3 reps each: rotation, flexion/extension, D1 pattern; Serratus Anterior x10 reps  Manual Therapy Myofascial Release: Prone with upper back exposed To upper trapezius with Hawk Grip tools to decrease pain and fascial restrictions  Physical Therapy Assessment and Plan PT Assessment and Plan Clinical Impression Statement: Provided pt with red theraband and exercises to work at home.  added POE and prone exercises to improve scapular and cervical strength.  At end of session pt reports significant reduction in stiffness and improves cervical AROM to WNL.  Overall is progressing well with improved fascial mobility and cervical joint mobility.   PT Plan: f/u on prone activities, manual therapy in prone vs. sitting.  Continue to advance prone exercises and cervical strengthening activites.      Goals Home Exercise Program Pt will Perform Home Exercise Program: Independently PT Goal: Perform Home Exercise Program - Progress: Met PT Short Term Goals Time to Complete Short Term Goals: 2 weeks PT Short Term Goal 1: Pt will report 75% decrease in Lt leg radicular symptoms.  PT Short Term Goal 1 - Progress: Met PT Short Term Goal 2: Pt will improve her core coordination and strength in order to sit with appriororiate posture for 20 mintues.  PT Short Term Goal 2 - Progress: Met PT Long Term Goals Time to Complete Long Term Goals: 4 weeks PT Long Term Goal 1: Pt will improve her ODI to less than 25% for improved QOL.  PT Long Term Goal 2: Pt will improve her core strength and activity tolerance to [facility] in order to tolerate sitting for greater than 2 hours for her job duties.  PT Long Term Goal 2 - Progress: Met Long Term Goal 3: Pt will present with decreased fascial restrictions to scapular and lower back region in order to improve QOL. Long Term Goal 3 Progress: Progressing toward goal  Problem List Patient Active Problem List   Diagnosis Date Noted  . Lumbago 11/28/2012  . Cervicalgia 11/28/2012   PT - End of Session Activity Tolerance: Patient tolerated treatment well General Behavior During Therapy: WFL for tasks assessed/performed Cognition: WFL for tasks performed PT Plan of Care PT Patient Instructions: increase hydration following manual therapy.  Consulted and Agree with Plan of Care: Patient  Annett Fabian, MPT, ATC 01/04/2013, 3:23 PM

## 2013-01-09 ENCOUNTER — Ambulatory Visit (HOSPITAL_COMMUNITY)
Admission: RE | Admit: 2013-01-09 | Discharge: 2013-01-09 | Disposition: A | Discharge status: Home / Self Care | Payer: BC Managed Care – PPO | Source: Ambulatory Visit | Attending: Family Medicine | Admitting: Family Medicine

## 2013-01-09 NOTE — Progress Notes (Signed)
Physical Therapy Treatment Patient Details  Name: Karen Huynh MRN: [id number] Date of Birth: [birth year]  Today's Date: 01/09/2013 Time: 4098-1191 PT Time Calculation (min): 41 min Charges: 32' TE, 8' Manual  Visit#: 11 of 12  Re-eval: 01/10/13    Subjective: Symptoms/Limitations Symptoms: "I was bruised last time you used those tools on my back." Overall pt reports that her stiffness is still there but is getting better.  Pain Assessment Currently in Pain?: Yes Pain Score:   2 Pain Location: Neck  Precautions/Restrictions     Exercise/Treatments Mobility/Balance        Stretches   Machines for Strengthening UBE (Upper Arm Bike): 6' @ 2.0 postural Theraband Exercises Scapula Retraction: 10 reps;Blue;Limitations Scapula Retraction Limitations: Low level Shoulder External Rotation: 10 reps;Blue Standing Exercises Wall Push Ups: 15 reps;Limitations Wall Push Ups Limitations: on raised mat Upper Extremity Flexion with Stabilization: 15 reps;Flexion Prone Exercises W Back: 10 reps Shoulder Extension: Limitations Shoulder Extension Limitations: 12 reps Rows: Limitations Rows Limitations: 12 reps Upper Extremity Flexion with Stabilization: Limitations UE Flexion with Stabilization Limitations: 12 reps Other Prone Exercise: Prone on Elbows: x5 reps each: rotation, flexion/extension, D1 pattern; Serratus Anterior x10 reps, Shoulder ER x10 reps Other Prone Exercise: Shoulder abduction 12 reps  Manual Therapy Myofascial Release: Seated to Lt upper trapezius to decrease fascial rescitions and trigger point release with soft tissue massage after x8 minutes   Physical Therapy Assessment and Plan PT Assessment and Plan Clinical Impression Statement: Pt continues to improve overall function and mobility with decreased pain.  Added more prone activities to improve strength and mobility.  PT Plan: Re-eval and possible D/C    Goals    Problem List Patient Active Problem  List   Diagnosis Date Noted  . Lumbago 11/28/2012  . Cervicalgia 11/28/2012    PT - End of Session Activity Tolerance: Patient tolerated treatment well General Behavior During Therapy: WFL for tasks assessed/performed Cognition: WFL for tasks performed  GP    Maudry Zeidan 01/09/2013, 4:01 PM

## 2013-01-10 ENCOUNTER — Ambulatory Visit (HOSPITAL_COMMUNITY)
Admission: RE | Admit: 2013-01-10 | Discharge: 2013-01-10 | Disposition: A | Discharge status: Home / Self Care | Payer: BC Managed Care – PPO | Source: Ambulatory Visit | Attending: Family Medicine | Admitting: Family Medicine

## 2013-01-10 NOTE — Progress Notes (Addendum)
Physical Therapy Re-evaluation / discharge  Patient Details  Name: Karen Huynh MRN: [id number] Date of Birth: 08/05/1951  Today's Date: 01/10/2013 Time: 4098-1191 PT Time Calculation (min): 40 min              Visit#: 12 of 12  Re-eval: 01/10/13 Diagnosis: LBP Charges:  MMT , physical performance test 15', massage 15'   Subjective Symptoms/Limitations Symptoms: Pt. states she knows today is it.  States overall improvement with slight discomfort L cervical (upper trap) region.  Pain no greater than 2//10.   Sensation/Coordination/Flexibility/Functional Tests Functional Tests Functional Tests: Oswestry Disability Index (ODI): 10% (was 56%)  Objective: RLE Strength Right Hip Flexion: 5/5 Right Hip Extension: 4/5 Right Hip ABduction: 5/5 Right Knee Flexion: 5/5 Right Knee Extension: 5/5  LLE Strength Left Hip Flexion: 5/5 Left Hip Extension: 5/5 Left Hip ABduction: 5/5 Left Knee Flexion: 5/5 Left Knee Extension: 5/5  Exercise/Treatments UBE 6'backward Soft tissue massage: Seated to UT to decrease fascial restrictions and improve mobility x15 minutes.  Physical Therapy Assessment and Plan PT Assessment and Plan Clinical Impression Statement: Pt with overall 90% improvement, has met all goals and is agreeable to discharge to HEP.  Pt. is independent with HEP and only with minimal pain <2/10 in Lt upper trap region. PT Plan: Discharge to HEP.    Goals Home Exercise Program Pt will Perform Home Exercise Program: Independently - Progress: Met  PT Short Term Goals Time to Complete Short Term Goals: 2 weeks PT Short Term Goal 1: Pt will report 75% decrease in Lt leg radicular symptoms. - Progress: Met PT Short Term Goal 2: Pt will improve her core coordination and strength in order to sit with appriororiate posture for 20 mintues. - Progress: Met  PT Long Term Goals Time to Complete Long Term Goals: 4 weeks PT Long Term Goal 1: Pt will improve her ODI to less than 25%  for improved QOL.-Progress: Met PT Long Term Goal 2: Pt will improve her core strength and activity tolerance to [facility] in order to tolerate sitting for greater than 2 hours for her job duties - Progress: Met Long Term Goal 3: Pt will present with decreased fascial restrictions to scapular and lower back region in order to improve QOL.Progress: Met  Problem List Patient Active Problem List   Diagnosis Date Noted  . Lumbago 11/28/2012  . Cervicalgia 11/28/2012    PT - End of Session Activity Tolerance: Patient tolerated treatment well General Behavior During Therapy: WFL for tasks assessed/performed Cognition: WFL for tasks performed   Lurena Nida, PTA/CLT; Annett Fabian, MPT, ATC 01/10/2013, 4:48 PM

## 2013-03-20 ENCOUNTER — Other Ambulatory Visit: Payer: Self-pay | Admitting: Adult Health

## 2013-03-20 DIAGNOSIS — Z139 Encounter for screening, unspecified: Secondary | ICD-10-CM

## 2013-03-26 ENCOUNTER — Ambulatory Visit (HOSPITAL_COMMUNITY)
Admission: RE | Admit: 2013-03-26 | Discharge: 2013-03-26 | Disposition: A | Discharge status: Home / Self Care | Payer: BC Managed Care – PPO | Source: Ambulatory Visit | Attending: Adult Health | Admitting: Adult Health

## 2013-03-26 DIAGNOSIS — Z139 Encounter for screening, unspecified: Secondary | ICD-10-CM

## 2013-03-26 DIAGNOSIS — Z1231 Encounter for screening mammogram for malignant neoplasm of breast: Secondary | ICD-10-CM | POA: Insufficient documentation

## 2013-03-27 ENCOUNTER — Other Ambulatory Visit: Payer: Self-pay | Admitting: Adult Health

## 2013-03-27 DIAGNOSIS — R928 Other abnormal and inconclusive findings on diagnostic imaging of breast: Secondary | ICD-10-CM

## 2013-04-10 ENCOUNTER — Ambulatory Visit (HOSPITAL_COMMUNITY)
Admission: RE | Admit: 2013-04-10 | Discharge: 2013-04-10 | Disposition: A | Discharge status: Home / Self Care | Payer: BC Managed Care – PPO | Source: Ambulatory Visit | Attending: Adult Health | Admitting: Adult Health

## 2013-04-10 ENCOUNTER — Other Ambulatory Visit: Payer: Self-pay | Admitting: Adult Health

## 2013-04-10 ENCOUNTER — Other Ambulatory Visit (HOSPITAL_COMMUNITY): Payer: Self-pay | Admitting: Adult Health

## 2013-04-10 DIAGNOSIS — R928 Other abnormal and inconclusive findings on diagnostic imaging of breast: Secondary | ICD-10-CM

## 2013-04-10 DIAGNOSIS — N6009 Solitary cyst of unspecified breast: Secondary | ICD-10-CM | POA: Insufficient documentation

## 2013-06-17 ENCOUNTER — Encounter (HOSPITAL_COMMUNITY): Payer: Self-pay | Admitting: Oncology

## 2013-06-17 ENCOUNTER — Encounter (HOSPITAL_COMMUNITY): Payer: BC Managed Care – PPO | Attending: Hematology and Oncology

## 2013-06-17 ENCOUNTER — Other Ambulatory Visit (HOSPITAL_COMMUNITY): Payer: Self-pay | Admitting: Oncology

## 2013-06-17 DIAGNOSIS — D72819 Decreased white blood cell count, unspecified: Secondary | ICD-10-CM

## 2013-06-17 HISTORY — DX: Decreased white blood cell count, unspecified: D72.819

## 2013-06-17 LAB — CBC WITH DIFFERENTIAL/PLATELET
Eosinophils Absolute: 0.1 10*3/uL (ref 0.0–0.7)
Hemoglobin: 13.6 g/dL (ref 12.0–15.0)
Lymphocytes Relative: 67 % — ABNORMAL HIGH (ref 12–46)
Lymphs Abs: 2.2 10*3/uL (ref 0.7–4.0)
MCH: 28.4 pg (ref 26.0–34.0)
MCV: 85.8 fL (ref 78.0–100.0)
Monocytes Relative: 7 % (ref 3–12)
Neutrophils Relative %: 22 % — ABNORMAL LOW (ref 43–77)
RBC: 4.79 MIL/uL (ref 3.87–5.11)
WBC: 3.3 10*3/uL — ABNORMAL LOW (ref 4.0–10.5)

## 2013-06-17 NOTE — Progress Notes (Signed)
Labs drawn today for cbc/diff,anca screen

## 2013-06-18 LAB — ANCA SCREEN W REFLEX TITER: p-ANCA Screen: NEGATIVE

## 2013-06-26 ENCOUNTER — Encounter (HOSPITAL_BASED_OUTPATIENT_CLINIC_OR_DEPARTMENT_OTHER): Payer: BC Managed Care – PPO

## 2013-06-26 ENCOUNTER — Encounter (HOSPITAL_COMMUNITY): Payer: Self-pay

## 2013-06-26 VITALS — BP 133/55 | HR 71 | Temp 98.1°F | Resp 20 | Wt 181.0 lb

## 2013-06-26 DIAGNOSIS — Z23 Encounter for immunization: Secondary | ICD-10-CM

## 2013-06-26 DIAGNOSIS — D72819 Decreased white blood cell count, unspecified: Secondary | ICD-10-CM

## 2013-06-26 MED ORDER — INFLUENZA VAC SPLIT QUAD 0.5 ML IM SUSP
0.5000 mL | Freq: Once | INTRAMUSCULAR | Status: AC
  Administered 2013-06-26: 0.5 mL via INTRAMUSCULAR
  Filled 2013-06-26: qty 0.5

## 2013-06-26 NOTE — Patient Instructions (Signed)
[facility] Discharge Instructions  RECOMMENDATIONS MADE BY THE CONSULTANT AND ANY TEST RESULTS WILL BE SENT TO YOUR REFERRING PHYSICIAN.  EXAM FINDINGS BY THE PHYSICIAN TODAY AND SIGNS OR SYMPTOMS TO REPORT TO CLINIC OR PRIMARY PHYSICIAN: Exam and findings as discussed by Dr. Zigmund Daniel. No follow-up needed at this time.  Report fever, recurring infections, soaking night sweats, etc.   MEDICATIONS PRESCRIBED:  none  INSTRUCTIONS/FOLLOW-UP: No follow-up needed at this time.   Thank you for choosing [facility] to provide your oncology and hematology care.  To afford each patient quality time with our providers, please arrive at least 15 minutes before your scheduled appointment time.  With your help, our goal is to use those 15 minutes to complete the necessary work-up to ensure our physicians have the information they need to help with your evaluation and healthcare recommendations.    Effective January 1st, 2014, we ask that you re-schedule your appointment with our physicians should you arrive 10 or more minutes late for your appointment.  We strive to give you quality time with our providers, and arriving late affects you and other patients whose appointments are after yours.    Again, thank you for choosing [facility].  Our hope is that these requests will decrease the amount of time that you wait before being seen by our physicians.       _____________________________________________________________  Should you have questions after your visit to [facility], please contact our office at [phone] between the hours of 8:30 a.m. and 5:00 p.m.  Voicemails left after 4:30 p.m. will not be returned until the following business day.  For prescription refill requests, have your pharmacy contact our office with your prescription refill request.

## 2013-06-26 NOTE — Progress Notes (Signed)
[facility]  OFFICE PROGRESS NOTE  Colette Ribas, MD [address] [place] [address]  DIAGNOSIS: Leukopenia  Chief Complaint  Patient presents with  . leukopenia    CURRENT THERAPY: Watchful expectation.  INTERVAL HISTORY: Karen Huynh 62 y.o. female returns for followup of benign cyclic neutropenia.  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 She denies any fever, night sweats, easy satiety, frequent infections necessitating antibiotic use, dysuria, hematuria, melena, hematochezia, hematuria, lower extremity swelling or redness, chest pain, cough, wheezing, nasal drip, skin rash, joint pain except for back discomfort or seizures. She was recently diagnosed with bone spurs by MRI but no surgical intervention was recommended. She does use nonsteroidal anti-inflammatory agents preemptively and symptoms are controlled.   MEDICAL HISTORY: Past Medical History  Diagnosis Date  . H/O: rheumatic fever     as child  . History of pneumonia   . Leukopenia   . Leukopenia 06/17/2013  . High cholesterol     INTERIM HISTORY: has Lumbago; Cervicalgia; and Leukopenia on her problem list.    ALLERGIES:  has No Known Allergies.  MEDICATIONS: has a current medication list which includes the following prescription(s): atorvastatin, vitamin d, and fish oil.  SURGICAL HISTORY: History reviewed. No pertinent past surgical history.  FAMILY HISTORY: family history includes Aneurysm in her father; Cancer in her maternal aunt, mother, and other; Colon cancer in her mother.  SOCIAL HISTORY:  reports that she has been smoking Cigarettes.  She has a 10.5 pack-year smoking history. She has never used smokeless tobacco. She reports that she does not drink alcohol or use illicit drugs.  REVIEW OF SYSTEMS:  Other than that discussed above is noncontributory.  PHYSICAL  EXAMINATION: ECOG PERFORMANCE STATUS: 0 - Asymptomatic  Blood pressure 133/55, temperature 98.1 F (36.7 C), temperature source Oral, resp. rate 20, weight 181 lb (82.101 kg), SpO2 100.00%.  GENERAL:alert, no distress and comfortable SKIN: skin color, texture, turgor are normal, no rashes or significant lesions EYES: PERLA; Conjunctiva are pink and non-injected, sclera clear OROPHARYNX:no exudate, no erythema on lips, buccal mucosa, or tongue. NECK: supple, thyroid normal size, non-tender, without nodularity. No masses CHEST: No breast masses with normal AP diameter. LYMPH:  no palpable lymphadenopathy in the cervical, axillary or inguinal LUNGS: clear to auscultation and percussion with normal breathing effort HEART: regular rate & rhythm and no murmurs and no lower extremity edema ABDOMEN:abdomen soft, non-tender and normal bowel sounds MUSCULOSKELETAL:no cyanosis of digits and no clubbing. Range of motion normal.  NEURO: alert & oriented x 3 with fluent speech, no focal motor/sensory deficits   LABORATORY DATA: Infusion on 06/17/2013  Component Date Value Range Status  . WBC 06/17/2013 3.3* 4.0 - 10.5 K/uL Final  . RBC 06/17/2013 4.79  3.87 - 5.11 MIL/uL Final  . Hemoglobin 06/17/2013 13.6  12.0 - 15.0 g/dL Final  . HCT 91/47/8295 41.1  36.0 - 46.0 % Final  . MCV 06/17/2013 85.8  78.0 - 100.0 fL Final  . MCH 06/17/2013 28.4  26.0 - 34.0 pg Final  . MCHC 06/17/2013 33.1  30.0 - 36.0 g/dL Final  . RDW 62/13/0865 13.8  11.5 - 15.5 % Final  . Platelets 06/17/2013 251  150 - 400 K/uL Final  . Neutrophils Relative % 06/17/2013 22* 43 - 77 % Final  . Neutro Abs 06/17/2013 0.7* 1.7 - 7.7 K/uL  Final  . Lymphocytes Relative 06/17/2013 67* 12 - 46 % Final  . Lymphs Abs 06/17/2013 2.2  0.7 - 4.0 K/uL Final  . Monocytes Relative 06/17/2013 7  3 - 12 % Final  . Monocytes Absolute 06/17/2013 0.2  0.1 - 1.0 K/uL Final  . Eosinophils Relative 06/17/2013 2  0 - 5 % Final  . Eosinophils  Absolute 06/17/2013 0.1  0.0 - 0.7 K/uL Final  . Basophils Relative 06/17/2013 1  0 - 1 % Final  . Basophils Absolute 06/17/2013 0.0  0.0 - 0.1 K/uL Final  . c-ANCA Screen 06/17/2013 NEGATIVE  NEGATIVE Final  . p-ANCA Screen 06/17/2013 NEGATIVE  NEGATIVE Final  . Atypical p-ANCA Screen 06/17/2013 NEGATIVE  NEGATIVE Final   Comment: (NOTE)                          ANCA Screen includes evaluation for p-ANCA, c-ANCA and Atypical                          p-ANCA.                          Performed at [facility]    PATHOLOGY:  Urinalysis No results found for this basename: colorurine, appearanceur, labspec, phurine, glucoseu, hgbur, bilirubinur, ketonesur, proteinur, urobilinogen, nitrite, leukocytesur    RADIOGRAPHIC STUDIES: No results found.  ASSESSMENT:  #1. 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 #2. Lumbar bone spurs, treated medically.   PLAN:  #1. Influenza virus vaccine. #2. Call if develops fevers or antibiotics can be started and CBC check for appropriate leukemoid reaction. #3. No further appointments were made in this office.   All questions were answered. The patient knows to call the clinic with any problems, questions or concerns. We can certainly see the patient much sooner if necessary.   I spent 25 minutes counseling the patient face to face. The total time spent in the appointment was 30 minutes.    Maurilio Lovely, MD 06/26/2013 9:20 AM

## 2013-06-26 NOTE — Progress Notes (Signed)
Karen Huynh presents today for injection per MD orders. Flu vaccine administered IM in right Upper Arm. Administration without incident. Patient tolerated well.

## 2013-08-12 ENCOUNTER — Ambulatory Visit (INDEPENDENT_AMBULATORY_CARE_PROVIDER_SITE_OTHER): Payer: BC Managed Care – PPO | Admitting: Adult Health

## 2013-08-12 ENCOUNTER — Other Ambulatory Visit (HOSPITAL_COMMUNITY)
Admission: RE | Admit: 2013-08-12 | Discharge: 2013-08-12 | Disposition: A | Discharge status: Home / Self Care | Payer: BC Managed Care – PPO | Source: Ambulatory Visit | Attending: Adult Health | Admitting: Adult Health

## 2013-08-12 ENCOUNTER — Encounter: Payer: Self-pay | Admitting: Adult Health

## 2013-08-12 ENCOUNTER — Encounter (INDEPENDENT_AMBULATORY_CARE_PROVIDER_SITE_OTHER): Payer: Self-pay

## 2013-08-12 VITALS — BP 122/72 | HR 72 | Ht 63.0 in | Wt 183.0 lb

## 2013-08-12 DIAGNOSIS — R252 Cramp and spasm: Secondary | ICD-10-CM

## 2013-08-12 DIAGNOSIS — Z01419 Encounter for gynecological examination (general) (routine) without abnormal findings: Secondary | ICD-10-CM | POA: Insufficient documentation

## 2013-08-12 DIAGNOSIS — Z1151 Encounter for screening for human papillomavirus (HPV): Secondary | ICD-10-CM | POA: Insufficient documentation

## 2013-08-12 DIAGNOSIS — E7800 Pure hypercholesterolemia, unspecified: Secondary | ICD-10-CM

## 2013-08-12 DIAGNOSIS — E785 Hyperlipidemia, unspecified: Secondary | ICD-10-CM | POA: Insufficient documentation

## 2013-08-12 DIAGNOSIS — D72819 Decreased white blood cell count, unspecified: Secondary | ICD-10-CM

## 2013-08-12 DIAGNOSIS — Z1212 Encounter for screening for malignant neoplasm of rectum: Secondary | ICD-10-CM

## 2013-08-12 HISTORY — DX: Cramp and spasm: R25.2

## 2013-08-12 LAB — LIPID PANEL
Cholesterol: 220 mg/dL — ABNORMAL HIGH (ref 0–200)
HDL: 55 mg/dL (ref >39)
LDL Cholesterol: 156 mg/dL — ABNORMAL HIGH (ref 0–99)
Triglycerides: 45 mg/dL (ref <150)
VLDL: 9 mg/dL (ref 0–40)

## 2013-08-12 LAB — COMPREHENSIVE METABOLIC PANEL
AST: 19 U/L (ref 0–37)
Albumin: 4.1 g/dL (ref 3.5–5.2)
Alkaline Phosphatase: 60 U/L (ref 39–117)
BUN: 15 mg/dL (ref 6–23)
Creat: 0.68 mg/dL (ref 0.50–1.10)
Glucose, Bld: 89 mg/dL (ref 70–99)
Total Bilirubin: 0.5 mg/dL (ref 0.3–1.2)

## 2013-08-12 LAB — HEMOCCULT GUIAC POC 1CARD (OFFICE): Fecal Occult Blood, POC: NEGATIVE

## 2013-08-12 NOTE — Progress Notes (Signed)
Patient ID: Karen Huynh, female   DOB: 1951/06/30, 62 y.o.   MRN: [id number] History of Present Illness: Karen Huynh is a 62 year old black female, married in for pap and physical.Complains of muscle cramps, had bad one Saturday left arm, still sore.   Current Medications, Allergies, Past Medical History, Past Surgical History, Family History and Social History were reviewed in [employer] record.   Past Medical History  Diagnosis Date  . H/O: rheumatic fever     as child  . History of pneumonia   . Leukopenia   . Leukopenia 06/17/2013  . High cholesterol   . Muscle cramps 08/12/2013  History reviewed. No pertinent past surgical history. Current outpatient prescriptions:atorvastatin (LIPITOR) 10 MG tablet, Take 10 mg by mouth daily., Disp: , Rfl: ;  Cholecalciferol (VITAMIN D) 2000 UNITS CAPS, Take 1 capsule by mouth daily., Disp: , Rfl: ;  Omega-3 Fatty Acids (FISH OIL) 1000 MG CAPS, Take 1 capsule by mouth daily., Disp: , Rfl:   Review of Systems: Patient denies any headaches, blurred vision, shortness of breath, chest pain, abdominal pain, problems with bowel movements, urination, or intercourse. No mood swings, does have muscle cramps.    Physical Exam:BP 122/72  Pulse 72  Ht 5\' 3"  (1.6 m)  Wt 183 lb (83.008 kg)  BMI 32.43 kg/m2 General:  Well developed, well nourished, no acute distress Skin:  Warm and dry Neck:  Midline trachea, normal thyroid, no carotid bruits Lungs; Clear to auscultation bilaterally Breast:  No dominant palpable mass, retraction, or nipple discharge Cardiovascular: Regular rate and rhythm Abdomen:  Soft, non tender, no hepatosplenomegaly Pelvic:  External genitalia is normal in appearance.  The vagina is normal in appearance. The cervix is smooth, pap with HPV performed.  Uterus is felt to be normal size, shape, and contour.  No                adnexal masses or tenderness noted. Rectal: Good sphincter tone, no polyps, or hemorrhoids  felt.  Hemoccult negative. Extremities:  No swelling or varicosities noted, has good muscle strength in upper extremities Psych:  No mood changes, alert and cooperative,seems happy   Impression: Yearly gyn exam Muscle cramps History of elevated cholesterol History of leukopenia    Plan: Check CBC,CMP,TSH and lipids Physical in 1 year Mammogram yearly Colonoscopy per Dr Darrick Penna Follow up labs this week by phone or my chart

## 2013-08-12 NOTE — Patient Instructions (Signed)
Physical in 1 year Mammogram yearly  Colonoscopy per Dr fields Follow up labs

## 2013-08-13 ENCOUNTER — Telehealth: Payer: Self-pay | Admitting: Adult Health

## 2013-08-13 LAB — CBC WITH DIFFERENTIAL/PLATELET
Basophils Absolute: 0 10*3/uL (ref 0.0–0.1)
Basophils Relative: 1 % (ref 0–1)
Eosinophils Absolute: 0.1 10*3/uL (ref 0.0–0.7)
Eosinophils Relative: 3 % (ref 0–5)
HCT: 42.2 % (ref 36.0–46.0)
Hemoglobin: 13.9 g/dL (ref 12.0–15.0)
MCH: 28 pg (ref 26.0–34.0)
MCHC: 32.9 g/dL (ref 30.0–36.0)
MCV: 84.9 fL (ref 78.0–100.0)
Monocytes Absolute: 0.4 10*3/uL (ref 0.1–1.0)
Monocytes Relative: 14 % — ABNORMAL HIGH (ref 3–12)
Neutro Abs: 0.4 10*3/uL — ABNORMAL LOW (ref 1.7–7.7)
RDW: 14.4 % (ref 11.5–15.5)

## 2013-08-13 NOTE — Telephone Encounter (Signed)
Pt aware of labs, will mail her a copy 

## 2014-03-27 ENCOUNTER — Other Ambulatory Visit: Payer: Self-pay | Admitting: Adult Health

## 2014-03-27 DIAGNOSIS — Z1231 Encounter for screening mammogram for malignant neoplasm of breast: Secondary | ICD-10-CM

## 2014-04-07 ENCOUNTER — Ambulatory Visit (HOSPITAL_COMMUNITY)
Admission: RE | Admit: 2014-04-07 | Discharge: 2014-04-07 | Disposition: A | Discharge status: Home / Self Care | Payer: BC Managed Care – PPO | Source: Ambulatory Visit | Attending: Adult Health | Admitting: Adult Health

## 2014-04-07 DIAGNOSIS — Z1231 Encounter for screening mammogram for malignant neoplasm of breast: Secondary | ICD-10-CM | POA: Insufficient documentation

## 2014-04-08 ENCOUNTER — Encounter: Payer: Self-pay | Admitting: Gastroenterology

## 2014-06-30 ENCOUNTER — Encounter: Payer: Self-pay | Admitting: Adult Health

## 2014-08-14 ENCOUNTER — Other Ambulatory Visit: Payer: BC Managed Care – PPO | Admitting: Adult Health

## 2014-09-03 ENCOUNTER — Ambulatory Visit (INDEPENDENT_AMBULATORY_CARE_PROVIDER_SITE_OTHER): Payer: BLUE CROSS/BLUE SHIELD | Admitting: Adult Health

## 2014-09-03 ENCOUNTER — Encounter: Payer: Self-pay | Admitting: Adult Health

## 2014-09-03 VITALS — BP 140/80 | HR 76 | Ht 63.25 in | Wt 185.5 lb

## 2014-09-03 DIAGNOSIS — Z1212 Encounter for screening for malignant neoplasm of rectum: Secondary | ICD-10-CM

## 2014-09-03 DIAGNOSIS — E7800 Pure hypercholesterolemia, unspecified: Secondary | ICD-10-CM

## 2014-09-03 DIAGNOSIS — Z01419 Encounter for gynecological examination (general) (routine) without abnormal findings: Secondary | ICD-10-CM

## 2014-09-03 DIAGNOSIS — M546 Pain in thoracic spine: Secondary | ICD-10-CM

## 2014-09-03 HISTORY — DX: Pain in thoracic spine: M54.6

## 2014-09-03 LAB — HEMOCCULT GUIAC POC 1CARD (OFFICE): Fecal Occult Blood, POC: NEGATIVE

## 2014-09-03 NOTE — Patient Instructions (Signed)
Physical in 1 year Mammogram yearly Get colonoscopy scheduled with Dr Oneida Alar

## 2014-09-03 NOTE — Progress Notes (Signed)
Patient ID: Karen Huynh, female   DOB: 12-17-1950, 64 y.o.   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  Current Medications, Allergies, Past Medical History, Past Surgical History, Family History and Social History were reviewed in [employer] record.     Review of Systems: Patient denies any headaches, blurred vision, shortness of breath, chest pain, abdominal pain, problems with  urination, or intercourse. Not having sex, husband has problem.She has some constipation and uses apple juice with relief.No joint pain or mood swings, see HPI for positives.    Physical Exam:BP 140/80 mmHg  Pulse 76  Ht 5' 3.25" (1.607 m)  Wt 185 lb 8 oz (84.142 kg)  BMI 32.58 kg/m2 General:  Well developed, well nourished, no acute distress Skin:  Warm and dry Neck:  Midline trachea, normal thyroid,no carotid bruits heard Lungs; Clear to auscultation bilaterally, has some discomfort along thoracic spine with pressure, no pain with deep breaths Breast:  No dominant palpable mass, retraction, or nipple discharge Cardiovascular: Regular rate and rhythm Abdomen:  Soft, non tender, no hepatosplenomegaly Pelvic:  External genitalia is normal in appearance, no lesions.  The vagina has decrease color, moisture and rugae.  The cervix is smooth.  Uterus is felt to be normal size, shape, and contour.  No      adnexal masses or tenderness noted. Rectal: Good sphincter tone, no polyps, or hemorrhoids felt.  Hemoccult negative. Extremities:  No swelling or varicosities noted Psych:  No mood changes,alert and cooperative,seems happy   Impression: Well woman gyn exam no pap Thoracic  spine pain Elevated cholesterol    Plan: Check CBC, CMP and lipids Get chest xray and thoracic spine xray Mammogram yearly  Call Dr Oneida Alar and schedule F/U colonoscopy Physical in 1 year Will talk when labs and x rays back

## 2014-09-04 LAB — LIPID PANEL
CHOL/HDL RATIO: 4 ratio
Cholesterol: 189 mg/dL (ref 0–200)
HDL: 47 mg/dL (ref >39)
LDL Cholesterol: 131 mg/dL — ABNORMAL HIGH (ref 0–99)
TRIGLYCERIDES: 54 mg/dL (ref <150)
VLDL: 11 mg/dL (ref 0–40)

## 2014-09-04 LAB — COMPREHENSIVE METABOLIC PANEL
ALBUMIN: 4 g/dL (ref 3.5–5.2)
ALT: 21 U/L (ref 0–35)
AST: 17 U/L (ref 0–37)
Alkaline Phosphatase: 60 U/L (ref 39–117)
BUN: 20 mg/dL (ref 6–23)
CALCIUM: 9.2 mg/dL (ref 8.4–10.5)
CO2: 27 mEq/L (ref 19–32)
CREATININE: 0.67 mg/dL (ref 0.50–1.10)
Chloride: 108 mEq/L (ref 96–112)
GLUCOSE: 94 mg/dL (ref 70–99)
Potassium: 4.4 mEq/L (ref 3.5–5.3)
Sodium: 141 mEq/L (ref 135–145)
Total Bilirubin: 0.4 mg/dL (ref 0.2–1.2)
Total Protein: 6.4 g/dL (ref 6.0–8.3)

## 2014-09-04 LAB — CBC
HEMATOCRIT: 41.7 % (ref 36.0–46.0)
HEMOGLOBIN: 13.4 g/dL (ref 12.0–15.0)
MCH: 27.8 pg (ref 26.0–34.0)
MCHC: 32.1 g/dL (ref 30.0–36.0)
MCV: 86.5 fL (ref 78.0–100.0)
MPV: 9.7 fL (ref 8.6–12.4)
Platelets: 253 10*3/uL (ref 150–400)
RBC: 4.82 MIL/uL (ref 3.87–5.11)
RDW: 14.2 % (ref 11.5–15.5)
WBC: 3.2 10*3/uL — ABNORMAL LOW (ref 4.0–10.5)

## 2014-09-05 ENCOUNTER — Telehealth: Payer: Self-pay | Admitting: Adult Health

## 2014-09-05 NOTE — Telephone Encounter (Signed)
Left message with lab results

## 2014-10-06 ENCOUNTER — Ambulatory Visit (HOSPITAL_COMMUNITY)
Admission: RE | Admit: 2014-10-06 | Discharge: 2014-10-06 | Disposition: A | Discharge status: Home / Self Care | Payer: BLUE CROSS/BLUE SHIELD | Source: Ambulatory Visit | Attending: Adult Health | Admitting: Adult Health

## 2014-10-06 ENCOUNTER — Telehealth: Payer: Self-pay | Admitting: Adult Health

## 2014-10-06 DIAGNOSIS — G8929 Other chronic pain: Secondary | ICD-10-CM | POA: Diagnosis not present

## 2014-10-06 DIAGNOSIS — M545 Low back pain: Secondary | ICD-10-CM | POA: Insufficient documentation

## 2014-10-06 DIAGNOSIS — M546 Pain in thoracic spine: Secondary | ICD-10-CM | POA: Diagnosis not present

## 2014-10-06 NOTE — Telephone Encounter (Signed)
Pt aware chest x ray normal and that has OA in thoracic spine

## 2015-01-08 ENCOUNTER — Other Ambulatory Visit (HOSPITAL_COMMUNITY): Payer: Self-pay | Admitting: Family Medicine

## 2015-01-08 DIAGNOSIS — M546 Pain in thoracic spine: Secondary | ICD-10-CM

## 2015-01-12 ENCOUNTER — Telehealth: Payer: Self-pay | Admitting: Family Medicine

## 2015-01-12 NOTE — Telephone Encounter (Signed)
Called pt to let her know she has been accepted into practice and to set up new pt apt.

## 2015-01-13 ENCOUNTER — Encounter: Payer: Self-pay | Admitting: *Deleted

## 2015-01-16 ENCOUNTER — Ambulatory Visit (HOSPITAL_COMMUNITY)
Admission: RE | Admit: 2015-01-16 | Discharge: 2015-01-16 | Disposition: A | Discharge status: Home / Self Care | Payer: BLUE CROSS/BLUE SHIELD | Source: Ambulatory Visit | Attending: Family Medicine | Admitting: Family Medicine

## 2015-01-16 DIAGNOSIS — M546 Pain in thoracic spine: Secondary | ICD-10-CM

## 2015-01-19 ENCOUNTER — Telehealth: Payer: Self-pay | Admitting: *Deleted

## 2015-01-19 NOTE — Telephone Encounter (Signed)
LMTRC

## 2015-01-19 NOTE — Telephone Encounter (Signed)
-----   Message from [place] sent at 01/19/2015 10:35 AM EDT ----- This is a new patient to [place], but is calling to ask about an mri  That is in epic  [phone]

## 2015-01-20 NOTE — Telephone Encounter (Signed)
Pt called back and stated that her previous provider had put in for her to get MRI of Back but was for the wrong area of back and wanted to know if I could order another MRI for her . Pt is new pt to [place] and has appt scheduled for Friday, I informed pt that I could not order MRI since seh is new pt and that she had to see Dr. Buelah Manis before can order and she can go into detail with her at the appt. Pt understood figured that is what needed to be done.

## 2015-01-23 ENCOUNTER — Ambulatory Visit: Payer: BLUE CROSS/BLUE SHIELD | Admitting: Family Medicine

## 2015-02-03 ENCOUNTER — Ambulatory Visit (INDEPENDENT_AMBULATORY_CARE_PROVIDER_SITE_OTHER): Payer: BLUE CROSS/BLUE SHIELD | Admitting: Family Medicine

## 2015-02-03 ENCOUNTER — Encounter: Payer: Self-pay | Admitting: Family Medicine

## 2015-02-03 VITALS — BP 138/72 | HR 76 | Temp 98.4°F | Resp 14 | Ht 66.0 in | Wt 180.0 lb

## 2015-02-03 DIAGNOSIS — M5136 Other intervertebral disc degeneration, lumbar region: Secondary | ICD-10-CM | POA: Diagnosis not present

## 2015-02-03 DIAGNOSIS — E785 Hyperlipidemia, unspecified: Secondary | ICD-10-CM | POA: Diagnosis not present

## 2015-02-03 DIAGNOSIS — Z23 Encounter for immunization: Secondary | ICD-10-CM | POA: Diagnosis not present

## 2015-02-03 DIAGNOSIS — M5135 Other intervertebral disc degeneration, thoracolumbar region: Secondary | ICD-10-CM | POA: Insufficient documentation

## 2015-02-03 DIAGNOSIS — D72819 Decreased white blood cell count, unspecified: Secondary | ICD-10-CM

## 2015-02-03 MED ORDER — SIMVASTATIN 10 MG PO TABS: 10.0000 mg | ORAL_TABLET | Freq: Every day | ORAL | Status: DC

## 2015-02-03 MED ORDER — HYDROCODONE-ACETAMINOPHEN 5-325 MG PO TABS: 1.0000 | ORAL_TABLET | Freq: Four times a day (QID) | ORAL | Status: DC | PRN

## 2015-02-03 NOTE — Assessment & Plan Note (Signed)
Noted in her chart she has history of chronic leukopenia she was followed by oncology in the past this is benign.

## 2015-02-03 NOTE — Assessment & Plan Note (Signed)
Advised her to take her cholesterol medication every night in order to reduce risk factors for coronary artery disease.

## 2015-02-03 NOTE — Patient Instructions (Signed)
Take cholesterol medication every night  MRI to be scheduled Shingles vaccine given Release of records- [place]  F/U 6 months

## 2015-02-03 NOTE — Assessment & Plan Note (Signed)
DDD/OA seen on xray, worsening pain. She has had treatment with anti-inflammatory as well as physical therapy in the past. I'm going to set her up with MRI of lumbar spine. I did give her 45 tablets of hydrocodone today. I reviewed her GYN records as well as her labs.

## 2015-02-03 NOTE — Progress Notes (Signed)
Patient ID: Karen Huynh, female   DOB: 08/18/1951, 64 y.o.   MRN: [id number]   Subjective:    Patient ID: Karen Huynh, female    DOB: 1951/06/06, 64 y.o.   MRN: [id number]  Patient presents for New Patient- Establish Care and L eft Lower Back Pain  patient here to establish care. Her previous primary care provider was [facility]. She is followed by [facility]. She has history of hyperlipidemia as well as degenerative disc disease in the cervical spine thoracic and lumbar spine. She's had increase lower back pain of the past couple months, that wraps around her side and goes toward her buttocks at times. No change in bowel or bladder, no paresthesia lower ext.  She saw her primary care provider they ordered an MRI of the thoracic spine however when she went to have this done they  that where her pain was located it would not be picked up on the imaging therefore she did not have it done. She has been maintained on hydrocodone as well as Flexeril. She's had physical therapy in the past as well as steroid-induced packs with minimal improvement.  Meds reviewed- she is not taking zocor every day  Colonoscopy- Due Shingles- Due Does not think she had TDAP in 2013  Review Of Systems:  GEN- denies fatigue, fever, weight loss,weakness, recent illness HEENT- denies eye drainage, change in vision, nasal discharge, CVS- denies chest pain, palpitations RESP- denies SOB, cough, wheeze ABD- denies N/V, change in stools, abd pain GU- denies dysuria, hematuria, dribbling, incontinence MSK-+ joint pain, muscle aches, injury Neuro- denies headache, dizziness, syncope, seizure activity       Objective:    BP 138/72 mmHg  Pulse 76  Temp(Src) 98.4 F (36.9 C) (Oral)  Resp 14  Ht 5\' 6"  (1.676 m)  Wt 180 lb (81.647 kg)  BMI 29.07 kg/m2 GEN- NAD, alert and oriented x3 HEENT- PERRL, EOMI, non injected sclera, pink conjunctiva, MMM, oropharynx clear Neck- Supple, no thyromegaly CVS- RRR,  no murmur RESP-CTAB ABD-NABS,soft,NT,ND MSK- TTP lumbar Spine and lower thoracic spine + SLR LEft side, decreased ROM of entire spine Neuro- CNII-XII intact, no deficits, DTR symmetric  EXT- No edema Pulses- Radial, DP- 2+        Assessment & Plan:      Problem List Items Addressed This Visit    None      Note: This dictation was prepared with Dragon dictation along with smaller phrase technology. Any transcriptional errors that result from this process are unintentional.

## 2015-02-18 ENCOUNTER — Ambulatory Visit (HOSPITAL_COMMUNITY)
Admission: RE | Admit: 2015-02-18 | Discharge: 2015-02-18 | Disposition: A | Discharge status: Home / Self Care | Payer: BLUE CROSS/BLUE SHIELD | Source: Ambulatory Visit | Attending: Family Medicine | Admitting: Family Medicine

## 2015-02-18 DIAGNOSIS — M545 Low back pain: Secondary | ICD-10-CM | POA: Diagnosis present

## 2015-02-18 DIAGNOSIS — M47896 Other spondylosis, lumbar region: Secondary | ICD-10-CM | POA: Diagnosis not present

## 2015-02-18 DIAGNOSIS — M5135 Other intervertebral disc degeneration, thoracolumbar region: Secondary | ICD-10-CM

## 2015-02-18 DIAGNOSIS — M5136 Other intervertebral disc degeneration, lumbar region: Secondary | ICD-10-CM

## 2015-02-23 ENCOUNTER — Other Ambulatory Visit: Payer: Self-pay | Admitting: *Deleted

## 2015-02-23 DIAGNOSIS — M5137 Other intervertebral disc degeneration, lumbosacral region: Secondary | ICD-10-CM

## 2015-02-23 DIAGNOSIS — M5416 Radiculopathy, lumbar region: Secondary | ICD-10-CM

## 2015-03-10 ENCOUNTER — Telehealth: Payer: Self-pay | Admitting: Family Medicine

## 2015-03-10 NOTE — Telephone Encounter (Signed)
noted 

## 2015-03-10 NOTE — Telephone Encounter (Signed)
Raquel Sarna from Dr Alessandra Grout office called and left a VM stating that the pt has an apt on 7/19 at [facility] and the pt is aware of apt.

## 2015-03-31 ENCOUNTER — Telehealth: Payer: Self-pay | Admitting: Family Medicine

## 2015-03-31 DIAGNOSIS — M5136 Other intervertebral disc degeneration, lumbar region: Secondary | ICD-10-CM

## 2015-03-31 MED ORDER — HYDROCODONE-ACETAMINOPHEN 5-325 MG PO TABS
1.0000 | ORAL_TABLET | Freq: Four times a day (QID) | ORAL | Status: DC | PRN
Start: 2015-03-31 — End: 2015-08-07

## 2015-03-31 NOTE — Telephone Encounter (Signed)
Patient calling to get rx for her hydrocodone [phone]

## 2015-03-31 NOTE — Telephone Encounter (Signed)
Ok to refill??  Last office visit/ refill 02/03/2015.

## 2015-03-31 NOTE — Telephone Encounter (Signed)
Okay to refill? 

## 2015-03-31 NOTE — Telephone Encounter (Signed)
Prescription printed and patient made aware to come to office to pick up on 03/31/2015 after 2 pm.

## 2015-06-10 ENCOUNTER — Other Ambulatory Visit: Payer: Self-pay | Admitting: Adult Health

## 2015-06-10 DIAGNOSIS — Z1231 Encounter for screening mammogram for malignant neoplasm of breast: Secondary | ICD-10-CM

## 2015-06-17 ENCOUNTER — Ambulatory Visit (HOSPITAL_COMMUNITY): Payer: BLUE CROSS/BLUE SHIELD

## 2015-06-26 ENCOUNTER — Ambulatory Visit (HOSPITAL_COMMUNITY)
Admission: RE | Admit: 2015-06-26 | Discharge: 2015-06-26 | Disposition: A | Discharge status: Home / Self Care | Payer: BLUE CROSS/BLUE SHIELD | Source: Ambulatory Visit | Attending: Adult Health | Admitting: Adult Health

## 2015-06-26 DIAGNOSIS — Z1231 Encounter for screening mammogram for malignant neoplasm of breast: Secondary | ICD-10-CM | POA: Diagnosis not present

## 2015-08-07 ENCOUNTER — Encounter: Payer: Self-pay | Admitting: Family Medicine

## 2015-08-07 ENCOUNTER — Ambulatory Visit (INDEPENDENT_AMBULATORY_CARE_PROVIDER_SITE_OTHER): Payer: BLUE CROSS/BLUE SHIELD | Admitting: Family Medicine

## 2015-08-07 VITALS — BP 128/62 | HR 80 | Temp 98.3°F | Resp 14 | Ht 66.0 in | Wt 183.0 lb

## 2015-08-07 DIAGNOSIS — Z418 Encounter for other procedures for purposes other than remedying health state: Secondary | ICD-10-CM | POA: Diagnosis not present

## 2015-08-07 DIAGNOSIS — M5135 Other intervertebral disc degeneration, thoracolumbar region: Secondary | ICD-10-CM | POA: Diagnosis not present

## 2015-08-07 DIAGNOSIS — M5136 Other intervertebral disc degeneration, lumbar region: Secondary | ICD-10-CM

## 2015-08-07 DIAGNOSIS — Z299 Encounter for prophylactic measures, unspecified: Secondary | ICD-10-CM

## 2015-08-07 MED ORDER — SIMVASTATIN 10 MG PO TABS: 10.0000 mg | ORAL_TABLET | Freq: Every day | ORAL | Status: DC

## 2015-08-07 MED ORDER — HYDROCODONE-ACETAMINOPHEN 5-325 MG PO TABS: 1.0000 | ORAL_TABLET | Freq: Four times a day (QID) | ORAL | Status: DC | PRN

## 2015-08-07 MED ORDER — CYCLOBENZAPRINE HCL 10 MG PO TABS: 10.0000 mg | ORAL_TABLET | Freq: Three times a day (TID) | ORAL | Status: DC | PRN

## 2015-08-07 NOTE — Assessment & Plan Note (Signed)
I refilled her hydrocodone. I will give her 60 tablets to last a little bit longer since she comes in every 6 months. Also refilled her muscle relaxer. For now she is still staying active. We discussed watching her weight as to not cause more problems with her back pain. Flu shot also given.

## 2015-08-07 NOTE — Progress Notes (Signed)
Patient ID: Karen Huynh, female   DOB: March 18, 1951, 64 y.o.   MRN: [id number]   Subjective:    Patient ID: Karen Huynh, female    DOB: [birth year], 64 y.o.   MRN: [id number]  Patient presents for 6 Month F/U  patient here to follow-up medications. She has known degenerative disc disease facet arthritis mild nerve impingement she was seen by neurology she had facet injections done they helped some with her pain but she is controlling with the use of hydrocodone and her muscle relaxer which she uses sparingly. Her last prescription was given back in August. She denies any change in bowel or bladder. She has no new concerns today. Due for flu shot. She did go ahead and schedule her colonoscopy He is a GYN exam in January when have her fasting labs done then.    Review Of Systems:  GEN- denies fatigue, fever, weight loss,weakness, recent illness HEENT- denies eye drainage, change in vision, nasal discharge, CVS- denies chest pain, palpitations RESP- denies SOB, cough, wheeze ABD- denies N/V, change in stools, abd pain GU- denies dysuria, hematuria, dribbling, incontinence MSK-+oint pain, muscle aches, injury Neuro- denies headache, dizziness, syncope, seizure activity       Objective:    BP 128/62 mmHg  Pulse 80  Temp(Src) 98.3 F (36.8 C) (Oral)  Resp 14  Ht 5\' 6"  (1.676 m)  Wt 183 lb (83.008 kg)  BMI 29.55 kg/m2 GEN- NAD, alert and oriented x3 CVS- RRR, no murmur RESP-CTAB EXT- No edema        Assessment & Plan:      Problem List Items Addressed This Visit    DDD (degenerative disc disease), thoracolumbar - Primary   Relevant Medications   HYDROcodone-acetaminophen (NORCO/VICODIN) 5-325 MG tablet   cyclobenzaprine (FLEXERIL) 10 MG tablet   DDD (degenerative disc disease), lumbar   Relevant Medications   HYDROcodone-acetaminophen (NORCO/VICODIN) 5-325 MG tablet   cyclobenzaprine (FLEXERIL) 10 MG tablet    Other Visit Diagnoses    Need for prophylactic measure         Relevant Orders    Flu Vaccine QUAD 36+ mos PF IM (Fluarix & Fluzone Quad PF) (Completed)       Note: This dictation was prepared with Dragon dictation along with smaller phrase technology. Any transcriptional errors that result from this process are unintentional.

## 2015-08-07 NOTE — Patient Instructions (Signed)
Continue current medications Flu shot given F/U 6 months 

## 2015-08-13 ENCOUNTER — Other Ambulatory Visit: Payer: Self-pay

## 2015-08-13 ENCOUNTER — Encounter: Payer: Self-pay | Admitting: Gastroenterology

## 2015-08-13 ENCOUNTER — Ambulatory Visit (INDEPENDENT_AMBULATORY_CARE_PROVIDER_SITE_OTHER): Payer: BLUE CROSS/BLUE SHIELD | Admitting: Gastroenterology

## 2015-08-13 VITALS — BP 139/81 | HR 92 | Temp 97.3°F | Ht 67.0 in | Wt 182.8 lb

## 2015-08-13 DIAGNOSIS — Z1211 Encounter for screening for malignant neoplasm of colon: Secondary | ICD-10-CM

## 2015-08-13 DIAGNOSIS — K5901 Slow transit constipation: Secondary | ICD-10-CM | POA: Insufficient documentation

## 2015-08-13 DIAGNOSIS — Z860100 Personal history of colon polyps, unspecified: Secondary | ICD-10-CM | POA: Insufficient documentation

## 2015-08-13 MED ORDER — NA SULFATE-K SULFATE-MG SULF 17.5-3.13-1.6 GM/177ML PO SOLN: 1.0000 | Freq: Once | ORAL | Status: DC

## 2015-08-13 NOTE — Assessment & Plan Note (Signed)
LIKELY DUE TO AGE, PELVIC FLOOR DYSFUNCTION, AND MEDS(VICODIN).  TO TREAT CONSTIPATION:     1. DRINK WATER TO KEEP HER URINE LIGHT YELLOW.     2.  FOLLOW A HIGH FIBER DIET. AVOID ITEMS THAT CAUSE BLOATING & GAS. HANDOUT GIVEN.     3. TAKE IBGARD 2 DAILY WITH 8 OZ OF WATER FIRST THING IN THE MORNING.

## 2015-08-13 NOTE — Assessment & Plan Note (Signed)
FAMILY HISTORY OF COLON CANCER(MOTHER) & PERSONAL HISTORY OF A SIMPLE ADENOMA.       FOLLOW A FULL LIQUID DIET JAN 12.      TAKE BOWEL PREP JAN 12.       COLONOSCOPY JAN 13. DISCUSSED PROCEDURE, BENEFITS, & RISKS: < 1% chance of medication reaction, bleeding, perforation, or rupture of spleen/liver.

## 2015-08-13 NOTE — Progress Notes (Signed)
   Subjective:    Patient ID: Karen Huynh, female    DOB: [birth year], 64 y.o.   MRN: [id number] Karen Blackbird, MD  HPI DOING FAIRLY WELL. LAST SEEN SEP 2010. OCCASIONAL CONSTIPATION. PT DENIES FEVER, CHILLS, HEMATOCHEZIA, nausea, vomiting, melena, diarrhea, CHEST PAIN, SHORTNESS OF BREATH,  CHANGE IN BOWEL IN HABITS, abdominal pain, problems swallowing, problems with sedation, OR heartburn or indigestion.  Past Medical History  Diagnosis Date  . H/O: rheumatic fever     as child  . History of pneumonia   . Leukopenia   . Leukopenia 06/17/2013  . High cholesterol   . Muscle cramps 08/12/2013  . Thoracic spine pain 09/03/2014  . Constipation   . Heart murmur   . OA (osteoarthritis of spine)    Past Surgical History  Procedure Laterality Date  . Colonoscopy w/ biopsies and polypectomy  SEP 2010     SIMPLE ADENOMA(1)   Allergies  Allergen Reactions  . Penicillins     Gives pt yeast infection.    Current Outpatient Prescriptions  Medication Sig Dispense Refill  . Ascorbic Acid (VITAMIN C) 1000 MG tablet Take 1,000 mg by mouth daily.    . Cholecalciferol (VITAMIN D) 2000 UNITS CAPS Take 1 capsule by mouth daily.    . cyclobenzaprine (FLEXERIL) 10 MG tablet Take 1 tablet (10 mg total) by mouth 3 (three) times daily as needed for muscle spasms.    Mar[facility] Kitchen HYDROcodone-acetaminophen (NORCO/VICODIN) 5-325 MG tablet Take 1 tablet by mouth every 6 (six) hours as needed for moderate pain.    . simvastatin (ZOCOR) 10 MG tablet Take 1 tablet (10 mg total) by mouth daily.     Review of Systems PER HPI OTHERWISE ALL SYSTEMS ARE NEGATIVE.    Objective:   Physical Exam  Constitutional: She is oriented to person, place, and time. She appears well-developed and well-nourished. No distress.  HENT:  Head: Normocephalic and atraumatic.  Mouth/Throat: Oropharynx is clear and moist. No oropharyngeal exudate.  Eyes: Pupils are equal, round, and reactive to light. No scleral icterus.  Neck: Normal  range of motion. Neck supple.  Cardiovascular: Normal rate, regular rhythm and normal heart sounds.   Pulmonary/Chest: Effort normal and breath sounds normal. No respiratory distress.  Abdominal: Soft. Bowel sounds are normal. She exhibits no distension. There is no tenderness.  Musculoskeletal: She exhibits no edema.  Lymphadenopathy:    She has no cervical adenopathy.  Neurological: She is alert and oriented to person, place, and time.  NO FOCAL DEFICITS  Psychiatric: She has a normal mood and affect.  Vitals reviewed.     Assessment & Plan:

## 2015-08-13 NOTE — Patient Instructions (Addendum)
TO TREAT YOUR  CONSTIPATION:     1. DRINK WATER TO KEEP HER URINE LIGHT YELLOW.     2.  FOLLOW A HIGH FIBER DIET. AVOID ITEMS THAT CAUSE BLOATING & GAS. SEE HANDOUT.     3. TAKE IBGARD 2 DAILY WITH 8 OZ OF WATER FIRST THING IN THE MORNING.   **FOR YOUR COLONOSCOPY**       FOLLOW A FULL LIQUID DIET JAN 12.       TAKE BOWEL PREP JAN 12.        COLONOSCOPY JAN 13.  Full Liquid Diet A high-calorie, high-protein supplement should be used to meet your nutritional requirements when the full liquid diet is continued for more than 2 or 3 days. If this diet is to be used for an extended period of time (more than 7 days), a multivitamin should be considered.  Breads and Starches  Allowed: None are allowed except crackers pureed (made into a thick, smooth soup) in soup.   Avoid: Any others.    Potatoes/Pasta/Rice  Allowed: ANY ITEM AS A SOUP OR SMALL PLATE OF MASHED POTATOES OR SCRAMBLED EGGS.       Vegetables  Allowed: Strained tomato or vegetable juice. Vegetables pureed in soup.   Avoid: Any others.    Fruit  Allowed: Any strained fruit juices and fruit drinks. Include 1 serving of citrus or vitamin C-enriched fruit juice daily.   Avoid: Any others.  Meat and Meat Substitutes  Allowed: Egg  Avoid: Any meat, fish, or fowl. All cheese.  Milk  Allowed: SOY Milk beverages, including milk shakes and instant breakfast mixes. Smooth yogurt.   Avoid: Any others. Avoid dairy products if not tolerated.    Soups and Combination Foods  Allowed: Broth, strained cream soups. Strained, broth-based soups.   Avoid: Any others.    Desserts and Sweets  Allowed: flavored gelatin, tapioca, ice cream, sherbet, smooth pudding, junket, fruit ices, frozen ice pops, pudding pops, frozen fudge pops, chocolate syrup. Sugar, honey, jelly, syrup.   Avoid: Any others.  Fats and Oils  Allowed: Margarine, butter, cream, sour cream, oils.   Avoid: Any others.  Beverages  Allowed: All.    Avoid: None.  Condiments  Allowed: Iodized salt, pepper, spices, flavorings. Cocoa powder.   Avoid: Any others.    SAMPLE MEAL PLAN Breakfast   cup orange juice.   1 OR 2 EGGS  1 cup milk.   1 cup beverage (coffee or tea).   Cream or sugar, if desired.    Midmorning Snack  2 SCRAMBLED OR HARD BOILED EGG   Lunch  1 cup cream soup.    cup fruit juice.   1 cup milk.    cup custard.   1 cup beverage (coffee or tea).   Cream or sugar, if desired.    Midafternoon Snack  1 cup milk shake.  Dinner  1 cup cream soup.    cup fruit juice.   1 cup MILK    cup pudding.   1 cup beverage (coffee or tea).   Cream or sugar, if desired.  Evening Snack  1 cup supplement.  To increase calories, add sugar, cream, butter, or margarine if possible. Nutritional supplements will also increase the total calories.

## 2015-08-14 NOTE — Progress Notes (Signed)
cc'ed to pcp °

## 2015-09-08 ENCOUNTER — Encounter: Payer: Self-pay | Admitting: Adult Health

## 2015-09-08 ENCOUNTER — Ambulatory Visit (INDEPENDENT_AMBULATORY_CARE_PROVIDER_SITE_OTHER): Payer: BLUE CROSS/BLUE SHIELD | Admitting: Adult Health

## 2015-09-08 VITALS — BP 130/80 | HR 76 | Ht 63.5 in | Wt 181.0 lb

## 2015-09-08 DIAGNOSIS — K5901 Slow transit constipation: Secondary | ICD-10-CM

## 2015-09-08 DIAGNOSIS — Z1212 Encounter for screening for malignant neoplasm of rectum: Secondary | ICD-10-CM | POA: Diagnosis not present

## 2015-09-08 DIAGNOSIS — Z01419 Encounter for gynecological examination (general) (routine) without abnormal findings: Secondary | ICD-10-CM

## 2015-09-08 DIAGNOSIS — E7800 Pure hypercholesterolemia, unspecified: Secondary | ICD-10-CM

## 2015-09-08 LAB — HEMOCCULT GUIAC POC 1CARD (OFFICE): Fecal Occult Blood, POC: NEGATIVE

## 2015-09-08 NOTE — Patient Instructions (Signed)
Pap and physical in 1 year Mammogram yearly Colonoscopy 09/11/15 Dr Oneida Alar

## 2015-09-08 NOTE — Progress Notes (Signed)
Patient ID: Karen Huynh, female   DOB: 08/27/1951, 65 y.o.   MRN: [id number] History of Present Illness: Karen Huynh is a 65 year old black female in for a well woman gyn exam, she had a normal pap with negative HPV 08/12/13. PCP is Dr Buelah Manis.  Current Medications, Allergies, Past Medical History, Past Surgical History, Family History and Social History were reviewed in [place] record.     Review of Systems: Patient denies any headaches, hearing loss, fatigue, blurred vision, shortness of breath, chest pain, abdominal pain, problems with urination, or intercourse(not having sex). No joint pain or mood swings. Has low back pain and stiffness at times and constipation issues.She sees Dr Oneida Alar and has colonoscopy  Scheduled for 09/11/15. She got got flu shot and shingles shot in December.    Physical Exam:BP 130/80 mmHg  Pulse 76  Ht 5' 3.5" (1.613 m)  Wt 181 lb (82.101 kg)  BMI 31.56 kg/m2 General:  Well developed, well nourished, no acute distress Skin:  Warm and dry Neck:  Midline trachea, normal thyroid, good ROM, no lymphadenopathy, no carotid bruits heard Lungs; Clear to auscultation bilaterally Breast:  No dominant palpable mass, retraction, or nipple discharge Cardiovascular: Regular rate and rhythm Abdomen:  Soft, non tender, no hepatosplenomegaly Pelvic:  External genitalia is normal in appearance, no lesions.  The vagina is normal in appearance. Urethra has no lesions or masses. The cervix is smooth.  Uterus is felt to be normal size, shape, and contour.  No adnexal masses or tenderness noted.Bladder is non tender, no masses felt. Rectal: Good sphincter tone, no polyps, or hemorrhoids felt.  Hemoccult negative. Extremities/musculoskeletal:  No swelling or varicosities noted, no clubbing or cyanosis Psych:  No mood changes, alert and cooperative,seems happy   Impression: Well woman gyn exam no pap Elevated cholesterol Constipation     Plan: Check  CBC,CMP,TSH and lipids,A1c and vitamin D Mammogram yearly Colonoscopy 09/11/15 Pap and physical in 1 year

## 2015-09-09 LAB — LIPID PANEL
CHOLESTEROL TOTAL: 214 mg/dL — AB (ref 100–199)
Chol/HDL Ratio: 4.4 ratio units (ref 0.0–4.4)
HDL: 49 mg/dL (ref >39)
LDL CALC: 156 mg/dL — AB (ref 0–99)
TRIGLYCERIDES: 47 mg/dL (ref 0–149)
VLDL Cholesterol Cal: 9 mg/dL (ref 5–40)

## 2015-09-09 LAB — VITAMIN D 25 HYDROXY (VIT D DEFICIENCY, FRACTURES): VIT D 25 HYDROXY: 24.6 ng/mL — AB (ref 30.0–100.0)

## 2015-09-09 LAB — CBC
Hematocrit: 44.4 % (ref 34.0–46.6)
Hemoglobin: 14.3 g/dL (ref 11.1–15.9)
MCH: 27.6 pg (ref 26.6–33.0)
MCHC: 32.2 g/dL (ref 31.5–35.7)
MCV: 86 fL (ref 79–97)
PLATELETS: 280 10*3/uL (ref 150–379)
RBC: 5.19 x10E6/uL (ref 3.77–5.28)
RDW: 14 % (ref 12.3–15.4)
WBC: 3.7 10*3/uL (ref 3.4–10.8)

## 2015-09-09 LAB — COMPREHENSIVE METABOLIC PANEL
A/G RATIO: 1.8 (ref 1.1–2.5)
ALK PHOS: 66 IU/L (ref 39–117)
ALT: 28 IU/L (ref 0–32)
AST: 21 IU/L (ref 0–40)
Albumin: 4.4 g/dL (ref 3.6–4.8)
BILIRUBIN TOTAL: 0.4 mg/dL (ref 0.0–1.2)
BUN/Creatinine Ratio: 27 — ABNORMAL HIGH (ref 11–26)
BUN: 20 mg/dL (ref 8–27)
CALCIUM: 9.5 mg/dL (ref 8.7–10.3)
CHLORIDE: 105 mmol/L (ref 96–106)
CO2: 21 mmol/L (ref 18–29)
Creatinine, Ser: 0.75 mg/dL (ref 0.57–1.00)
GFR calc Af Amer: 97 mL/min/{1.73_m2} (ref >59)
GFR, EST NON AFRICAN AMERICAN: 85 mL/min/{1.73_m2} (ref >59)
GLOBULIN, TOTAL: 2.5 g/dL (ref 1.5–4.5)
GLUCOSE: 100 mg/dL — AB (ref 65–99)
POTASSIUM: 4.3 mmol/L (ref 3.5–5.2)
SODIUM: 142 mmol/L (ref 134–144)
Total Protein: 6.9 g/dL (ref 6.0–8.5)

## 2015-09-09 LAB — HEMOGLOBIN A1C
ESTIMATED AVERAGE GLUCOSE: 128 mg/dL
Hgb A1c MFr Bld: 6.1 % — ABNORMAL HIGH (ref 4.8–5.6)

## 2015-09-09 LAB — TSH: TSH: 1.19 u[IU]/mL (ref 0.450–4.500)

## 2015-09-10 ENCOUNTER — Telehealth: Payer: Self-pay | Admitting: Adult Health

## 2015-09-10 NOTE — Telephone Encounter (Signed)
Pt aware of labs, cut carbs and take vitamin D3 5000 iu for 1 month then decrease to 2000 IU daily and continue to be activity

## 2015-09-11 ENCOUNTER — Ambulatory Visit (HOSPITAL_COMMUNITY)
Admission: RE | Admit: 2015-09-11 | Discharge: 2015-09-11 | Disposition: A | Discharge status: Home / Self Care | Payer: BLUE CROSS/BLUE SHIELD | Source: Ambulatory Visit | Attending: Gastroenterology | Admitting: Gastroenterology

## 2015-09-11 ENCOUNTER — Encounter (HOSPITAL_COMMUNITY)
Admission: RE | Disposition: A | Discharge status: Home / Self Care | Payer: Self-pay | Source: Ambulatory Visit | Attending: Gastroenterology

## 2015-09-11 ENCOUNTER — Encounter (HOSPITAL_COMMUNITY): Payer: Self-pay | Admitting: *Deleted

## 2015-09-11 DIAGNOSIS — M479 Spondylosis, unspecified: Secondary | ICD-10-CM | POA: Insufficient documentation

## 2015-09-11 DIAGNOSIS — D122 Benign neoplasm of ascending colon: Secondary | ICD-10-CM

## 2015-09-11 DIAGNOSIS — E7800 Pure hypercholesterolemia, unspecified: Secondary | ICD-10-CM | POA: Diagnosis not present

## 2015-09-11 DIAGNOSIS — Z86010 Personal history of colonic polyps: Secondary | ICD-10-CM

## 2015-09-11 DIAGNOSIS — D125 Benign neoplasm of sigmoid colon: Secondary | ICD-10-CM

## 2015-09-11 DIAGNOSIS — Z79899 Other long term (current) drug therapy: Secondary | ICD-10-CM | POA: Diagnosis not present

## 2015-09-11 DIAGNOSIS — D123 Benign neoplasm of transverse colon: Secondary | ICD-10-CM | POA: Diagnosis not present

## 2015-09-11 DIAGNOSIS — Z1211 Encounter for screening for malignant neoplasm of colon: Secondary | ICD-10-CM | POA: Diagnosis not present

## 2015-09-11 DIAGNOSIS — Z800 Family history of malignant neoplasm of digestive organs: Secondary | ICD-10-CM | POA: Diagnosis not present

## 2015-09-11 HISTORY — PX: COLONOSCOPY: SHX5424

## 2015-09-11 SURGERY — COLONOSCOPY
Anesthesia: Moderate Sedation

## 2015-09-11 MED ORDER — MIDAZOLAM HCL 5 MG/5ML IJ SOLN
INTRAMUSCULAR | Status: AC
  Filled 2015-09-11: qty 10

## 2015-09-11 MED ORDER — MEPERIDINE HCL 100 MG/ML IJ SOLN
INTRAMUSCULAR | Status: DC | PRN
  Administered 2015-09-11 (×2): 50 mg via INTRAVENOUS

## 2015-09-11 MED ORDER — MEPERIDINE HCL 100 MG/ML IJ SOLN
INTRAMUSCULAR | Status: AC
  Filled 2015-09-11: qty 2

## 2015-09-11 MED ORDER — SODIUM CHLORIDE 0.9 % IV SOLN
INTRAVENOUS | Status: DC
  Administered 2015-09-11: 1000 mL via INTRAVENOUS

## 2015-09-11 MED ORDER — MIDAZOLAM HCL 5 MG/5ML IJ SOLN
INTRAMUSCULAR | Status: DC | PRN
  Administered 2015-09-11 (×2): 2 mg via INTRAVENOUS

## 2015-09-11 MED ORDER — STERILE WATER FOR IRRIGATION IR SOLN
Status: DC | PRN
  Administered 2015-09-11: 12:00:00

## 2015-09-11 NOTE — Discharge Instructions (Signed)
You had 4 polyps removed. You have LARGE internal AND EXTERNAL hemorrhoids and diverticulosis IN YOUR RIGHT AND LEFT COLON.   FOLLOW A HIGH FIBER DIET. AVOID ITEMS THAT CAUSE BLOATING. SEE INFO BELOW.  YOUR BIOPSY RESULTS WILL BE AVAILABLE IN MY CHART JAN 18 AND MY OFFICE WILL CONTACT YOU IN 10-14 DAYS WITH YOUR RESULTS.   Next colonoscopy in 1-3 years.   Colonoscopy Care After Read the instructions outlined below and refer to this sheet in the next week. These discharge instructions provide you with general information on caring for yourself after you leave the hospital. While your treatment has been planned according to the most current medical practices available, unavoidable complications occasionally occur. If you have any problems or questions after discharge, call DR. Orpha Dain, [phone].  ACTIVITY  You may resume your regular activity, but move at a slower pace for the next 24 hours.   Take frequent rest periods for the next 24 hours.   Walking will help get rid of the air and reduce the bloated feeling in your belly (abdomen).   No driving for 24 hours (because of the medicine (anesthesia) used during the test).   You may shower.   Do not sign any important legal documents or operate any machinery for 24 hours (because of the anesthesia used during the test).    NUTRITION  Drink plenty of fluids.   You may resume your normal diet as instructed by your doctor.   Begin with a light meal and progress to your normal diet. Heavy or fried foods are harder to digest and may make you feel sick to your stomach (nauseated).   Avoid alcoholic beverages for 24 hours or as instructed.    MEDICATIONS  You may resume your normal medications.   WHAT YOU CAN EXPECT TODAY  Some feelings of bloating in the abdomen.   Passage of more gas than usual.   Spotting of blood in your stool or on the toilet paper  .  IF YOU HAD POLYPS REMOVED DURING THE COLONOSCOPY:  Eat a soft  diet IF YOU HAVE NAUSEA, BLOATING, ABDOMINAL PAIN, OR VOMITING.    FINDING OUT THE RESULTS OF YOUR TEST Not all test results are available during your visit. DR. Oneida Alar WILL CALL YOU WITHIN 14 DAYS OF YOUR PROCEDUE WITH YOUR RESULTS. Do not assume everything is normal if you have not heard from DR. Eryn Krejci, CALL HER OFFICE AT [phone].  SEEK IMMEDIATE MEDICAL ATTENTION AND CALL THE OFFICE: [phone] IF:  You have more than a spotting of blood in your stool.   Your belly is swollen (abdominal distention).   You are nauseated or vomiting.   You have a temperature over 101F.   You have abdominal pain or discomfort that is severe or gets worse throughout the day.  Polyps, Colon  A polyp is extra tissue that grows inside your body. Colon polyps grow in the large intestine. The large intestine, also called the colon, is part of your digestive system. It is a long, hollow tube at the end of your digestive tract where your body makes and stores stool. Most polyps are not dangerous. They are benign. This means they are not cancerous. But over time, some types of polyps can turn into cancer. Polyps that are smaller than a pea are usually not harmful. But larger polyps could someday become or may already be cancerous. To be safe, doctors remove all polyps and test them.   PREVENTION There is not one sure way  to prevent polyps. You might be able to lower your risk of getting them if you:  Eat more fruits and vegetables and less fatty food.   Do not smoke.   Avoid alcohol.   Exercise every day.   Lose weight if you are overweight.   Eating more calcium and folate can also lower your risk of getting polyps. Some foods that are rich in calcium are milk, cheese, and broccoli. Some foods that are rich in folate are chickpeas, kidney beans, and spinach.   High-Fiber Diet A high-fiber diet changes your normal diet to include more whole grains, legumes, fruits, and vegetables. Changes in the  diet involve replacing refined carbohydrates with unrefined foods. The calorie level of the diet is essentially unchanged. The Dietary Reference Intake (recommended amount) for adult males is 38 grams per day. For adult females, it is 25 grams per day. Pregnant and lactating women should consume 28 grams of fiber per day. Fiber is the intact part of a plant that is not broken down during digestion. Functional fiber is fiber that has been isolated from the plant to provide a beneficial effect in the body. PURPOSE  Increase stool bulk.   Ease and regulate bowel movements.   Lower cholesterol.   REDUCE RISK OF COLON CANCER  INDICATIONS THAT YOU NEED MORE FIBER  Constipation and hemorrhoids.   Uncomplicated diverticulosis (intestine condition) and irritable bowel syndrome.   Weight management.   As a protective measure against hardening of the arteries (atherosclerosis), diabetes, and cancer.   GUIDELINES FOR INCREASING FIBER IN THE DIET  Start adding fiber to the diet slowly. A gradual increase of about 5 more grams (2 slices of whole-wheat bread, 2 servings of most fruits or vegetables, or 1 bowl of high-fiber cereal) per day is best. Too rapid an increase in fiber may result in constipation, flatulence, and bloating.   Drink enough water and fluids to keep your urine clear or pale yellow. Water, juice, or caffeine-free drinks are recommended. Not drinking enough fluid may cause constipation.   Eat a variety of high-fiber foods rather than one type of fiber.   Try to increase your intake of fiber through using high-fiber foods rather than fiber pills or supplements that contain small amounts of fiber.   The goal is to change the types of food eaten. Do not supplement your present diet with high-fiber foods, but replace foods in your present diet.   INCLUDE A VARIETY OF FIBER SOURCES  Replace refined and processed grains with whole grains, canned fruits with fresh fruits, and  incorporate other fiber sources. White rice, white breads, and most bakery goods contain little or no fiber.   Brown whole-grain rice, buckwheat oats, and many fruits and vegetables are all good sources of fiber. These include: broccoli, Brussels sprouts, cabbage, cauliflower, beets, sweet potatoes, white potatoes (skin on), carrots, tomatoes, eggplant, squash, berries, fresh fruits, and dried fruits.   Cereals appear to be the richest source of fiber. Cereal fiber is found in whole grains and bran. Bran is the fiber-rich outer coat of cereal grain, which is largely removed in refining. In whole-grain cereals, the bran remains. In breakfast cereals, the largest amount of fiber is found in those with "bran" in their names. The fiber content is sometimes indicated on the label.   You may need to include additional fruits and vegetables each day.   In baking, for 1 cup white flour, you may use the following substitutions:   1 cup whole-wheat  flour minus 2 tablespoons.   1/2 cup white flour plus 1/2 cup whole-wheat flour.   Diverticulosis Diverticulosis is a common condition that develops when small pouches (diverticula) form in the wall of the colon. The risk of diverticulosis increases with age. It happens more often in people who eat a low-fiber diet. Most individuals with diverticulosis have no symptoms. Those individuals with symptoms usually experience belly (abdominal) pain, constipation, or loose stools (diarrhea).  HOME CARE INSTRUCTIONS  Increase the amount of fiber in your diet as directed by your caregiver or dietician. This may reduce symptoms of diverticulosis.   Drink at least 6 to 8 glasses of water each day to prevent constipation.   Try not to strain when you have a bowel movement.   Avoiding nuts and seeds to prevent complications is NOT NECESSARY.   FOODS HAVING HIGH FIBER CONTENT INCLUDE:  Fruits. Apple, peach, pear, tangerine, raisins, prunes.   Vegetables. Brussels  sprouts, asparagus, broccoli, cabbage, carrot, cauliflower, romaine lettuce, spinach, summer squash, tomato, winter squash, zucchini.   Starchy Vegetables. Baked beans, kidney beans, lima beans, split peas, lentils, potatoes (with skin).   Grains. Whole wheat bread, brown rice, bran flake cereal, plain oatmeal, white rice, shredded wheat, bran muffins.   SEEK IMMEDIATE MEDICAL CARE IF:  You develop increasing pain or severe bloating.   You have an oral temperature above 101F.   You develop vomiting or bowel movements that are bloody or black.   Hemorrhoids Hemorrhoids are dilated (enlarged) veins around the rectum. Sometimes clots will form in the veins. This makes them swollen and painful. These are called thrombosed hemorrhoids. Causes of hemorrhoids include:  Constipation.   Straining to have a bowel movement.   HEAVY LIFTING  HOME CARE INSTRUCTIONS  Eat a well balanced diet and drink 6 to 8 glasses of water every day to avoid constipation. You may also use a bulk laxative.   Avoid straining to have bowel movements.   Keep anal area dry and clean.   Do not use a donut shaped pillow or sit on the toilet for long periods. This increases blood pooling and pain.   Move your bowels when your body has the urge; this will require less straining and will decrease pain and pressure.

## 2015-09-11 NOTE — Op Note (Addendum)
[facility] [address] [place], [address]   COLONOSCOPY PROCEDURE REPORT  PATIENT: Karen Huynh  MR#: [id number] BIRTHDATE: 1950/12/26 , 52  yrs. old GENDER: female ENDOSCOPIST: Danie Binder, MD REFERRED AY:[id number] Harris, M.D. PROCEDURE DATE:  09-30-15 PROCEDURE:   Colonoscopy with cold biopsy polypectomy and Colonoscopy with snare polypectomy INDICATIONS:high risk patient with personal history of colonic polyps and patient's immediate family history of colon cancer. MEDICATIONS: Demerol 100 mg IV and Versed 4 mg IV  DESCRIPTION OF PROCEDURE:    Physical exam was performed.  Informed consent was obtained from the patient after explaining the benefits, risks, and alternatives to procedure.  The patient was connected to monitor and placed in left lateral position. Continuous oxygen was provided by nasal cannula and IV medicine administered through an indwelling cannula.  After administration of sedation and rectal exam, the patients rectum was intubated and the [id number]  colonoscope was advanced under direct visualization to the cecum.  The scope was removed slowly by carefully examining the color, texture, anatomy, and integrity mucosa on the way out.  The patient was recovered in endoscopy and discharged home in satisfactory condition. Estimated blood loss is zero unless otherwise noted in this procedure report.    COLON FINDINGS: Three sessile polyps ranging from 4 to 18mm in size were found in the sigmoid colon and at the hepatic flexure(2).  A polypectomy was performed using snare cautery.  , A sessile polyp measuring 2 mm in size was found in the ascending colon.  A polypectomy was performed with cold forceps.  , There was moderate diverticulosis noted in the sigmoid colon, descending colon, and ascending colon with associated tortuosity and muscular hypertrophy.  , The colon was redundant.  Manual abdominal counter-pressure was used  to reach the cecum, and Large internal and external hemorrhoids were found.  PREP QUALITY: excellent.  CECAL W/D TIME: 19       minutes COMPLICATIONS: None  ENDOSCOPIC IMPRESSION: 1.   FOUR COLON polyps REMOVED 2.   The LEFT colon IS LSIGHTLY  redundant 3.   Moderate diverticulosis in the sigmoid colon, descending colon, and ascending colon 4.   Large internal and external hemorrhoids  RECOMMENDATIONS: AWAIT BIOPSY HIGH FIBER DIET NEXT TCS IN 1-3 YEARS    _______________________________ eSignedDanie Binder, MD 09-30-2015 12:14 PM    CPT CODES: ICD CODES:  The ICD and CPT codes recommended by this software are interpretations from the data that the clinical staff has captured with the software.  The verification of the translation of this report to the ICD and CPT codes and modifiers is the sole responsibility of the health care institution and practicing physician where this report was generated.  [employer] will not be held responsible for the validity of the ICD and CPT codes included on this report.  AMA assumes no liability for data contained or not contained herein. CPT is a Designer, television/film set of the [employer].

## 2015-09-11 NOTE — H&P (View-Only) (Signed)
   Subjective:    Patient ID: Karen Huynh, female    DOB: Mar 24, 1951, 65 y.o.   MRN: [id number] Karen Blackbird, MD  HPI DOING FAIRLY WELL. LAST SEEN SEP 2010. OCCASIONAL CONSTIPATION. PT DENIES FEVER, CHILLS, HEMATOCHEZIA, nausea, vomiting, melena, diarrhea, CHEST PAIN, SHORTNESS OF BREATH,  CHANGE IN BOWEL IN HABITS, abdominal pain, problems swallowing, problems with sedation, OR heartburn or indigestion.  Past Medical History  Diagnosis Date  . H/O: rheumatic fever     as child  . History of pneumonia   . Leukopenia   . Leukopenia 06/17/2013  . High cholesterol   . Muscle cramps 08/12/2013  . Thoracic spine pain 09/03/2014  . Constipation   . Heart murmur   . OA (osteoarthritis of spine)    Past Surgical History  Procedure Laterality Date  . Colonoscopy w/ biopsies and polypectomy  SEP 2010     SIMPLE ADENOMA(1)   Allergies  Allergen Reactions  . Penicillins     Gives pt yeast infection.    Current Outpatient Prescriptions  Medication Sig Dispense Refill  . Ascorbic Acid (VITAMIN C) 1000 MG tablet Take 1,000 mg by mouth daily.    . Cholecalciferol (VITAMIN D) 2000 UNITS CAPS Take 1 capsule by mouth daily.    . cyclobenzaprine (FLEXERIL) 10 MG tablet Take 1 tablet (10 mg total) by mouth 3 (three) times daily as needed for muscle spasms.    Marland Kitchen HYDROcodone-acetaminophen (NORCO/VICODIN) 5-325 MG tablet Take 1 tablet by mouth every 6 (six) hours as needed for moderate pain.    . simvastatin (ZOCOR) 10 MG tablet Take 1 tablet (10 mg total) by mouth daily.     Review of Systems PER HPI OTHERWISE ALL SYSTEMS ARE NEGATIVE.    Objective:   Physical Exam  Constitutional: She is oriented to person, place, and time. She appears well-developed and well-nourished. No distress.  HENT:  Head: Normocephalic and atraumatic.  Mouth/Throat: Oropharynx is clear and moist. No oropharyngeal exudate.  Eyes: Pupils are equal, round, and reactive to light. No scleral icterus.  Neck: Normal  range of motion. Neck supple.  Cardiovascular: Normal rate, regular rhythm and normal heart sounds.   Pulmonary/Chest: Effort normal and breath sounds normal. No respiratory distress.  Abdominal: Soft. Bowel sounds are normal. She exhibits no distension. There is no tenderness.  Musculoskeletal: She exhibits no edema.  Lymphadenopathy:    She has no cervical adenopathy.  Neurological: She is alert and oriented to person, place, and time.  NO FOCAL DEFICITS  Psychiatric: She has a normal mood and affect.  Vitals reviewed.     Assessment & Plan:

## 2015-09-11 NOTE — Interval H&P Note (Signed)
History and Physical Interval Note:  09/11/2015 11:23 AM  Karen Huynh  has presented today for surgery, with the diagnosis of screening colonoscopy  The various methods of treatment have been discussed with the patient and family. After consideration of risks, benefits and other options for treatment, the patient has consented to  Procedure(s) with comments: COLONOSCOPY (N/A) - 1030 - moved to 12:00 - pt knows to arrive at 11:00 as a surgical intervention .  The patient's history has been reviewed, patient examined, no change in status, stable for surgery.  I have reviewed the patient's chart and labs.  Questions were answered to the patient's satisfaction.     [employer]

## 2015-09-15 ENCOUNTER — Encounter (HOSPITAL_COMMUNITY): Payer: Self-pay | Admitting: Gastroenterology

## 2015-09-30 ENCOUNTER — Telehealth: Payer: Self-pay | Admitting: Gastroenterology

## 2015-09-30 NOTE — Telephone Encounter (Signed)
Please call pt. She had simple adenomas removed.  FOLLOW A HIGH FIBER DIET. AVOID ITEMS THAT CAUSE BLOATING. SEE INFO BELOW.  Next colonoscopy in 3 years.

## 2015-10-01 NOTE — Telephone Encounter (Signed)
Pt is aware.  

## 2015-11-10 ENCOUNTER — Telehealth: Payer: Self-pay | Admitting: Family Medicine

## 2015-11-10 DIAGNOSIS — M5136 Other intervertebral disc degeneration, lumbar region: Secondary | ICD-10-CM

## 2015-11-10 MED ORDER — CYCLOBENZAPRINE HCL 10 MG PO TABS: 10.0000 mg | ORAL_TABLET | Freq: Three times a day (TID) | ORAL | Status: DC | PRN

## 2015-11-10 MED ORDER — HYDROCODONE-ACETAMINOPHEN 5-325 MG PO TABS: 1.0000 | ORAL_TABLET | Freq: Four times a day (QID) | ORAL | Status: DC | PRN

## 2015-11-10 NOTE — Telephone Encounter (Signed)
Prescription printed and patient made aware to come to office to pick up on 11/11/2015.

## 2015-11-10 NOTE — Telephone Encounter (Signed)
Pt is requesting a refill on Hydrocodone 5-325 mg Flexaril 10 mg on a written script [phone]

## 2015-11-10 NOTE — Telephone Encounter (Signed)
Ok to refill??  Last office visit/ refill 09/26/2014. 

## 2015-11-10 NOTE — Telephone Encounter (Signed)
okay

## 2016-02-09 ENCOUNTER — Ambulatory Visit: Payer: Self-pay | Admitting: Family Medicine

## 2016-04-04 ENCOUNTER — Ambulatory Visit (INDEPENDENT_AMBULATORY_CARE_PROVIDER_SITE_OTHER): Payer: BLUE CROSS/BLUE SHIELD | Admitting: Family Medicine

## 2016-04-04 ENCOUNTER — Encounter: Payer: Self-pay | Admitting: Family Medicine

## 2016-04-04 VITALS — BP 138/68 | HR 80 | Temp 98.8°F | Resp 14 | Ht 63.5 in | Wt 178.0 lb

## 2016-04-04 DIAGNOSIS — E785 Hyperlipidemia, unspecified: Secondary | ICD-10-CM | POA: Diagnosis not present

## 2016-04-04 DIAGNOSIS — M5136 Other intervertebral disc degeneration, lumbar region: Secondary | ICD-10-CM | POA: Diagnosis not present

## 2016-04-04 DIAGNOSIS — R7303 Prediabetes: Secondary | ICD-10-CM

## 2016-04-04 MED ORDER — CYCLOBENZAPRINE HCL 10 MG PO TABS: 10.0000 mg | ORAL_TABLET | Freq: Three times a day (TID) | ORAL | 3 refills | Status: DC | PRN

## 2016-04-04 MED ORDER — HYDROCODONE-ACETAMINOPHEN 5-325 MG PO TABS: 1.0000 | ORAL_TABLET | Freq: Four times a day (QID) | ORAL | 0 refills | Status: DC | PRN

## 2016-04-04 NOTE — Assessment & Plan Note (Signed)
Recheck fasting labs and A1C

## 2016-04-04 NOTE — Progress Notes (Signed)
   Subjective:    Patient ID: Karen Huynh, female    DOB: Dec 25, 1950, 65 y.o.   MRN: [id number]  Patient presents for Medication Management ( chronic back pain- requesting refill on pain medication) Patient to follow-up medications. She has degenerative disc disease in her lumbar spine she takes Flexeril as needed as well as hydrocodone but very sparingly. She is requesting a refill on these. She does not want any surgical intervention. She has had epidurals did not have much improvement with them.  She was seen by her GYN [facility] physical exam-she had labs that show borderline diabetes with an A1c of 6.1% her cholesterol and also increased about 25.2 still taking her statin drug. She is due for repeat labs    Review Of Systems:  GEN- denies fatigue, fever, weight loss,weakness, recent illness HEENT- denies eye drainage, change in vision, nasal discharge, CVS- denies chest pain, palpitations RESP- denies SOB, cough, wheeze ABD- denies N/V, change in stools, abd pain GU- denies dysuria, hematuria, dribbling, incontinence MSK- +joint pain, muscle aches, injury Neuro- denies headache, dizziness, syncope, seizure activity       Objective:    BP 138/68 (BP Location: Left Arm, Patient Position: Sitting, Cuff Size: Normal)   Pulse 80   Temp 98.8 F (37.1 C) (Oral)   Resp 14   Ht 5' 3.5" (1.613 m)   Wt 178 lb (80.7 kg)   BMI 31.04 kg/m  GEN- NAD, alert and oriented x3 HEENT- PERRL, EOMI, non injected sclera, pink conjunctiva, MMM, oropharynx clear Neck- Supple,no LAD CVS- RRR, no murmur RESP-CTAB EXT- No edema Pulses- Radial 2+        Assessment & Plan:      Problem List Items Addressed This Visit    Hyperlipidemia - Primary    Recheck fasting labs and A1C       Relevant Orders   Lipid panel   DDD (degenerative disc disease), lumbar    Refilled flexeril, norco refilled      Relevant Medications   HYDROcodone-acetaminophen (NORCO/VICODIN) 5-325 MG tablet   cyclobenzaprine (FLEXERIL) 10 MG tablet   Borderline diabetes mellitus    Recheck A1C discussed diet       Relevant Orders   Hemoglobin A1c   CBC with Differential/Platelet   Comprehensive metabolic panel    Other Visit Diagnoses   None.     Note: This dictation was prepared with Dragon dictation along with smaller phrase technology. Any transcriptional errors that result from this process are unintentional.

## 2016-04-04 NOTE — Patient Instructions (Signed)
Get the labs done fasting  Continue current medications  F/U 6 months

## 2016-04-04 NOTE — Assessment & Plan Note (Signed)
Refilled flexeril, norco refilled

## 2016-04-04 NOTE — Assessment & Plan Note (Signed)
Recheck A1C discussed diet

## 2016-04-14 LAB — CBC WITH DIFFERENTIAL/PLATELET
BASOS ABS: 38 {cells}/uL (ref 0–200)
Basophils Relative: 1 %
EOS PCT: 5 %
Eosinophils Absolute: 190 cells/uL (ref 15–500)
HEMATOCRIT: 44.4 % (ref 35.0–45.0)
HEMOGLOBIN: 14.4 g/dL (ref 12.0–15.0)
LYMPHS ABS: 2584 {cells}/uL (ref 850–3900)
LYMPHS PCT: 68 %
MCH: 28 pg (ref 27.0–33.0)
MCHC: 32.4 g/dL (ref 32.0–36.0)
MCV: 86.4 fL (ref 80.0–100.0)
MPV: 9.8 fL (ref 7.5–12.5)
Monocytes Absolute: 456 cells/uL (ref 200–950)
Monocytes Relative: 12 %
NEUTROS PCT: 14 %
Neutro Abs: 532 cells/uL — ABNORMAL LOW (ref 1500–7800)
Platelets: 269 10*3/uL (ref 140–400)
RBC: 5.14 MIL/uL — ABNORMAL HIGH (ref 3.80–5.10)
RDW: 14 % (ref 11.0–15.0)
WBC: 3.8 10*3/uL (ref 3.8–10.8)

## 2016-04-14 LAB — COMPREHENSIVE METABOLIC PANEL
ALBUMIN: 4.4 g/dL (ref 3.6–5.1)
ALT: 23 U/L (ref 6–29)
AST: 20 U/L (ref 10–35)
Alkaline Phosphatase: 59 U/L (ref 33–130)
BUN: 17 mg/dL (ref 7–25)
CALCIUM: 9.5 mg/dL (ref 8.6–10.4)
CHLORIDE: 107 mmol/L (ref 98–110)
CO2: 28 mmol/L (ref 20–31)
Creat: 0.76 mg/dL (ref 0.50–0.99)
GLUCOSE: 103 mg/dL — AB (ref 70–99)
Potassium: 4.7 mmol/L (ref 3.5–5.3)
SODIUM: 140 mmol/L (ref 135–146)
Total Bilirubin: 0.3 mg/dL (ref 0.2–1.2)
Total Protein: 6.4 g/dL (ref 6.1–8.1)

## 2016-04-14 LAB — LIPID PANEL
CHOL/HDL RATIO: 3.7 ratio (ref <=5.0)
Cholesterol: 177 mg/dL (ref 125–200)
HDL: 48 mg/dL (ref >=46)
LDL CALC: 119 mg/dL (ref <130)
TRIGLYCERIDES: 51 mg/dL (ref <150)
VLDL: 10 mg/dL (ref <30)

## 2016-04-14 LAB — HEMOGLOBIN A1C
HEMOGLOBIN A1C: 5.6 % (ref <5.7)
Mean Plasma Glucose: 114 mg/dL

## 2016-04-21 ENCOUNTER — Encounter: Payer: Self-pay | Admitting: *Deleted

## 2016-06-23 ENCOUNTER — Other Ambulatory Visit: Payer: Self-pay | Admitting: Adult Health

## 2016-06-23 DIAGNOSIS — Z1231 Encounter for screening mammogram for malignant neoplasm of breast: Secondary | ICD-10-CM

## 2016-06-28 ENCOUNTER — Telehealth: Payer: Self-pay | Admitting: Family Medicine

## 2016-06-28 DIAGNOSIS — M5136 Other intervertebral disc degeneration, lumbar region: Secondary | ICD-10-CM

## 2016-06-28 NOTE — Telephone Encounter (Signed)
Patient is calling to get rx for her hydrocodone  [phone]

## 2016-06-29 MED ORDER — HYDROCODONE-ACETAMINOPHEN 5-325 MG PO TABS: 1.0000 | ORAL_TABLET | Freq: Four times a day (QID) | ORAL | 0 refills | Status: DC | PRN

## 2016-06-29 NOTE — Telephone Encounter (Signed)
Ok to refill??  Last office visit/ refill 04/04/2016.

## 2016-06-29 NOTE — Telephone Encounter (Signed)
Okay to refill? 

## 2016-06-29 NOTE — Telephone Encounter (Signed)
Prescription printed and patient made aware to come to office to pick up on 06/30/2016 per VM.

## 2016-07-04 ENCOUNTER — Ambulatory Visit (HOSPITAL_COMMUNITY)
Admission: RE | Admit: 2016-07-04 | Discharge: 2016-07-04 | Disposition: A | Discharge status: Home / Self Care | Payer: BLUE CROSS/BLUE SHIELD | Source: Ambulatory Visit | Attending: Adult Health | Admitting: Adult Health

## 2016-07-04 DIAGNOSIS — Z1231 Encounter for screening mammogram for malignant neoplasm of breast: Secondary | ICD-10-CM | POA: Diagnosis present

## 2016-08-09 ENCOUNTER — Ambulatory Visit (INDEPENDENT_AMBULATORY_CARE_PROVIDER_SITE_OTHER): Payer: BLUE CROSS/BLUE SHIELD

## 2016-08-09 DIAGNOSIS — Z23 Encounter for immunization: Secondary | ICD-10-CM

## 2016-09-03 ENCOUNTER — Encounter (HOSPITAL_COMMUNITY): Payer: Self-pay | Admitting: *Deleted

## 2016-09-03 ENCOUNTER — Emergency Department (HOSPITAL_COMMUNITY)
Admission: EM | Admit: 2016-09-03 | Discharge: 2016-09-03 | Disposition: A | Discharge status: Home / Self Care | Payer: BLUE CROSS/BLUE SHIELD | Attending: Emergency Medicine | Admitting: Emergency Medicine

## 2016-09-03 ENCOUNTER — Emergency Department (HOSPITAL_COMMUNITY): Payer: BLUE CROSS/BLUE SHIELD

## 2016-09-03 DIAGNOSIS — J209 Acute bronchitis, unspecified: Secondary | ICD-10-CM

## 2016-09-03 DIAGNOSIS — F17210 Nicotine dependence, cigarettes, uncomplicated: Secondary | ICD-10-CM | POA: Insufficient documentation

## 2016-09-03 DIAGNOSIS — Z79899 Other long term (current) drug therapy: Secondary | ICD-10-CM | POA: Diagnosis not present

## 2016-09-03 DIAGNOSIS — R0981 Nasal congestion: Secondary | ICD-10-CM | POA: Diagnosis present

## 2016-09-03 MED ORDER — AZITHROMYCIN 250 MG PO TABS: ORAL_TABLET | ORAL | 0 refills | Status: DC

## 2016-09-03 MED ORDER — ALBUTEROL SULFATE HFA 108 (90 BASE) MCG/ACT IN AERS: 1.0000 | INHALATION_SPRAY | Freq: Four times a day (QID) | RESPIRATORY_TRACT | 0 refills | Status: DC | PRN

## 2016-09-03 NOTE — ED Triage Notes (Signed)
Pt with nasal congestion and productive cough for 4 days.  Denies fever or N/V/D. Noted blood in sputum this morning.

## 2016-09-03 NOTE — ED Notes (Signed)
JI in to assess 

## 2016-09-03 NOTE — ED Notes (Signed)
JI in to reassess/discuss findings

## 2016-09-03 NOTE — ED Notes (Signed)
Pt complains of post nasal drip for the last 4 days- She reports that she has used OTC meds without relief. She also states that she smokes 1/2 PPD

## 2016-09-03 NOTE — ED Provider Notes (Signed)
[facility] DEPT Provider Note   CSN: [id number] Arrival date & time: 09/03/16  1306     History   Chief Complaint Chief Complaint  Patient presents with  . Nasal Congestion    HPI Karen Huynh is a 66 y.o. female with a history as outlined below, including prior history of pneumonia presenting with a 4 day history of uri type symptoms which have settled "in her chest".  She describes thick yellow nasal drainage along with congestion, denies PND or sore throat but reports increased cough which has been productive of a blood tinged mucus since yesterday.  She denies shortness of breath, wheezing and chest pain and has had no n/v/d or abdominal pain.  She has taken both mucinex and robitussin without relief of symptoms.  The history is provided by the patient.    Past Medical History:  Diagnosis Date  . Constipation   . H/O: rheumatic fever    as child  . Heart murmur   . High cholesterol   . History of pneumonia   . Leukopenia   . Leukopenia 06/17/2013  . Muscle cramps 08/12/2013  . OA (osteoarthritis of spine)   . Thoracic spine pain 09/03/2014    Patient Active Problem List   Diagnosis Date Noted  . Borderline diabetes mellitus 04/04/2016  . Screening for colon cancer   . History of colonic polyps 08/13/2015  . Constipation by delayed colonic transit 08/13/2015  . DDD (degenerative disc disease), lumbar 02/03/2015  . DDD (degenerative disc disease), thoracolumbar 02/03/2015  . Thoracic spine pain 09/03/2014  . Hyperlipidemia 08/12/2013  . Leukopenia 06/17/2013  . Lumbago 11/28/2012  . Cervicalgia 11/28/2012    Past Surgical History:  Procedure Laterality Date  . COLONOSCOPY N/A 09/11/2015   Procedure: COLONOSCOPY;  Surgeon: Danie Binder, MD;  Location: [facility] SUITE;  Service: Endoscopy;  Laterality: N/A;  1030 - moved to 12:00 - pt knows to arrive at 11:00  . COLONOSCOPY W/ BIOPSIES AND POLYPECTOMY  SEP 2010    SIMPLE ADENOMA(1)    OB History    Gravida  Para Term Preterm AB Living   2 2       2    SAB TAB Ectopic Multiple Live Births           2       Home Medications    Prior to Admission medications   Medication Sig Start Date End Date Taking? Authorizing Provider  Ascorbic Acid (VITAMIN C) 1000 MG tablet Take 1,000 mg by mouth daily.   Yes Historical Provider, MD  Cholecalciferol (VITAMIN D) 2000 UNITS CAPS Take 1 capsule by mouth daily.   Yes Historical Provider, MD  cyclobenzaprine (FLEXERIL) 10 MG tablet Take 1 tablet (10 mg total) by mouth 3 (three) times daily as needed for muscle spasms. 04/04/16  Yes Alycia Rossetti, MD  guaiFENesin (MUCINEX) 600 MG 12 hr tablet Take 600 mg by mouth 2 (two) times daily.   Yes Historical Provider, MD  guaifenesin (ROBITUSSIN) 100 MG/5ML syrup Take 200 mg by mouth 3 (three) times daily as needed for cough.   Yes Historical Provider, MD  HYDROcodone-acetaminophen (NORCO/VICODIN) 5-325 MG tablet Take 1 tablet by mouth every 6 (six) hours as needed for moderate pain. 06/29/16  Yes Alycia Rossetti, MD  simvastatin (ZOCOR) 10 MG tablet Take 1 tablet (10 mg total) by mouth daily. 08/07/15  Yes Alycia Rossetti, MD  albuterol (PROVENTIL HFA;VENTOLIN HFA) 108 (90 Base) MCG/ACT inhaler Inhale 1-2 puffs into the  lungs every 6 (six) hours as needed for wheezing or shortness of breath. 09/03/16   Evalee Jefferson, PA-C  azithromycin (ZITHROMAX Z-PAK) 250 MG tablet Take 2 tablets by mouth on day one followed by one tablet daily for 4 days. 09/03/16   Evalee Jefferson, PA-C    Family History Family History  Problem Relation Age of Onset  . Colon cancer Mother   . Cancer Mother   . Aneurysm Father   . Cancer Maternal Aunt   . Cancer Other     niece with breast cancer  . Cancer Brother     prostate  . Kidney failure Maternal Grandmother   . Dementia Maternal Grandfather   . Congestive Heart Failure Sister   . COPD Sister   . Hypertension Sister   . Asthma Brother   . Dementia Maternal Uncle   . Dementia Maternal Aunt    . Colon polyps Neg Hx     Social History Social History  Substance Use Topics  . Smoking status: Current Every Day Smoker    Packs/day: 0.25    Years: 42.00    Types: Cigarettes  . Smokeless tobacco: Never Used  . Alcohol use No     Allergies   Penicillins   Review of Systems Review of Systems  Constitutional: Negative for chills and fever.  HENT: Positive for congestion and rhinorrhea. Negative for ear pain, nosebleeds, sinus pressure, sore throat, trouble swallowing and voice change.   Eyes: Negative for discharge.  Respiratory: Positive for cough. Negative for chest tightness, shortness of breath, wheezing and stridor.   Cardiovascular: Negative for chest pain, palpitations and leg swelling.  Gastrointestinal: Negative for abdominal pain.  Genitourinary: Negative.      Physical Exam Updated Vital Signs BP 138/83 (BP Location: Left Arm)   Pulse 74   Temp 98.2 F (36.8 C) (Oral)   Resp 18   Ht 5\' 6"  (1.676 m)   Wt 81.6 kg   SpO2 100%   BMI 29.05 kg/m   Physical Exam  Constitutional: She appears well-developed and well-nourished.  HENT:  Head: Normocephalic and atraumatic.  Eyes: Conjunctivae are normal.  Neck: Normal range of motion.  Cardiovascular: Normal rate, regular rhythm, normal heart sounds and intact distal pulses.   Pulmonary/Chest: Effort normal. No accessory muscle usage. She has decreased breath sounds. She has no wheezes.  Mild decreased breath sounds throughout, no active wheezing.  Abdominal: Soft. Bowel sounds are normal. There is no tenderness.  Musculoskeletal: Normal range of motion.  Neurological: She is alert.  Skin: Skin is warm and dry.  Psychiatric: She has a normal mood and affect.  Nursing note and vitals reviewed.    ED Treatments / Results  Labs (all labs ordered are listed, but only abnormal results are displayed) Labs Reviewed - No data to display  EKG  EKG Interpretation None       Radiology Dg Chest 2  View  Result Date: 09/03/2016 CLINICAL DATA:  66 year old female with history of hemoptysis and cough for the past 4 days. EXAM: CHEST  2 VIEW COMPARISON:  Chest x-ray 10/06/2014. FINDINGS: Diffuse peribronchial cuffing. Lung volumes are normal. No consolidative airspace disease. No pleural effusions. No pneumothorax. No pulmonary nodule or mass noted. Pulmonary vasculature and the cardiomediastinal silhouette are within normal limits. Atherosclerosis in the thoracic aorta. IMPRESSION: 1. Severe diffuse peribronchial cuffing, concerning for an acute bronchitis. 2. Aortic atherosclerosis. Electronically Signed   By: Vinnie Langton M.D.   On: 09/03/2016 14:08    Procedures  Procedures (including critical care time)  Medications Ordered in ED Medications - No data to display   Initial Impression / Assessment and Plan / ED Course  I have reviewed the triage vital signs and the nursing notes.  Pertinent labs & imaging results that were available during my care of the patient were reviewed by me and considered in my medical decision making (see chart for details).  Clinical Course     Given pt smoking hx, imaging suggesting bronchitis and hemoptysis, will cover for possible bacterial source of lung infection.  She was placed on zithromax, albuterol mdi. Advised to take only mucinex or robitussin, not both as these are the same medicines.   Final Clinical Impressions(s) / ED Diagnoses   Final diagnoses:  Acute bronchitis, unspecified organism    New Prescriptions New Prescriptions   ALBUTEROL (PROVENTIL HFA;VENTOLIN HFA) 108 (90 BASE) MCG/ACT INHALER    Inhale 1-2 puffs into the lungs every 6 (six) hours as needed for wheezing or shortness of breath.   AZITHROMYCIN (ZITHROMAX Z-PAK) 250 MG TABLET    Take 2 tablets by mouth on day one followed by one tablet daily for 4 days.     Evalee Jefferson, PA-C 09/03/16 [place], MD 09/06/16 1558

## 2016-09-08 ENCOUNTER — Other Ambulatory Visit: Payer: Self-pay | Admitting: Family Medicine

## 2016-09-08 ENCOUNTER — Encounter: Payer: Self-pay | Admitting: Adult Health

## 2016-09-08 ENCOUNTER — Ambulatory Visit (INDEPENDENT_AMBULATORY_CARE_PROVIDER_SITE_OTHER): Payer: BLUE CROSS/BLUE SHIELD | Admitting: Adult Health

## 2016-09-08 ENCOUNTER — Other Ambulatory Visit (HOSPITAL_COMMUNITY)
Admission: RE | Admit: 2016-09-08 | Discharge: 2016-09-08 | Disposition: A | Discharge status: Home / Self Care | Payer: BLUE CROSS/BLUE SHIELD | Source: Ambulatory Visit | Attending: Adult Health | Admitting: Adult Health

## 2016-09-08 VITALS — BP 132/60 | HR 85 | Ht 63.5 in | Wt 174.0 lb

## 2016-09-08 DIAGNOSIS — Z01411 Encounter for gynecological examination (general) (routine) with abnormal findings: Secondary | ICD-10-CM

## 2016-09-08 DIAGNOSIS — Z01419 Encounter for gynecological examination (general) (routine) without abnormal findings: Secondary | ICD-10-CM | POA: Insufficient documentation

## 2016-09-08 DIAGNOSIS — J40 Bronchitis, not specified as acute or chronic: Secondary | ICD-10-CM

## 2016-09-08 DIAGNOSIS — Z1212 Encounter for screening for malignant neoplasm of rectum: Secondary | ICD-10-CM

## 2016-09-08 DIAGNOSIS — Z1211 Encounter for screening for malignant neoplasm of colon: Secondary | ICD-10-CM

## 2016-09-08 DIAGNOSIS — Z1151 Encounter for screening for human papillomavirus (HPV): Secondary | ICD-10-CM | POA: Insufficient documentation

## 2016-09-08 DIAGNOSIS — R599 Enlarged lymph nodes, unspecified: Secondary | ICD-10-CM

## 2016-09-08 DIAGNOSIS — E7800 Pure hypercholesterolemia, unspecified: Secondary | ICD-10-CM

## 2016-09-08 LAB — HEMOCCULT GUIAC POC 1CARD (OFFICE): Fecal Occult Blood, POC: NEGATIVE

## 2016-09-08 MED ORDER — METHYLPREDNISOLONE 4 MG PO TBPK: ORAL_TABLET | ORAL | 0 refills | Status: DC

## 2016-09-08 MED ORDER — MUPIROCIN 2 % EX OINT: 1.0000 "application " | TOPICAL_OINTMENT | Freq: Two times a day (BID) | CUTANEOUS | 0 refills | Status: DC

## 2016-09-08 NOTE — Progress Notes (Signed)
Patient ID: Karen Huynh, female   DOB: 13-May-1951, 66 y.o.   MRN: [id number] History of Present Illness: Karen Huynh is a 66 year old black female, married in for well woman gyn exam and pap.She was seen in ER 09/03/16 at [facility] and treated for bronchitis with Z pack and inhaler.She has knot left neck and sore in left nostril, just finished Z pack, still with cough at times. PCP is Dr Buelah Manis.  Current Medications, Allergies, Past Medical History, Past Surgical History, Family History and Social History were reviewed in [facility] record.     Review of Systems: Patient denies any headaches, hearing loss, fatigue, blurred vision, shortness of breath, chest pain, abdominal pain, problems with bowel movements, urination, or intercourse. No joint pain or mood swings.Has had cough and has sore in nose and knot left side of neck    Physical Exam:BP 132/60 (BP Location: Left Arm, Patient Position: Sitting, Cuff Size: Normal)   Pulse 85   Ht 5' 3.5" (1.613 m)   Wt 174 lb (78.9 kg)   BMI 30.34 kg/m  General:  Well developed, well nourished, no acute distress Skin:  Warm and dry, has sore left nostril Neck:  Midline trachea, normal thyroid, good ROM, has 1 cm round tender lymph on left side of neck, no carotid bruits heard Lungs; Decreased breath sounds, no wheezing  Breast:  No dominant palpable mass, retraction, or nipple discharge Cardiovascular: Regular rate and rhythm Abdomen:  Soft, non tender, no hepatosplenomegaly Pelvic:  External genitalia is normal in appearance, no lesions.  The vagina is normal in appearance. Urethra has no lesions or masses. The cervix is bulbous.Pap with HPV performed.  Uterus is felt to be normal size, shape, and contour.  No adnexal masses or tenderness noted.Bladder is non tender, no masses felt. Rectal: Good sphincter tone, no polyps, or hemorrhoids felt.  Hemoccult negative. Extremities/musculoskeletal:  No swelling or varicosities noted, no  clubbing or cyanosis Psych:  No mood changes, alert and cooperative,seems happy PHQ 2 score 0. Will rx medrol dose pack and recheck lymph node in 4 weeks.  Impression: 1. Encounter for gynecological examination with Papanicolaou smear of cervix   2. Bronchitis   3. Swollen lymph nodes   4. Elevated cholesterol   5. Screening for colorectal cancer       Plan: Meds ordered this encounter  Medications  . mupirocin ointment (BACTROBAN) 2 %    Sig: Place 1 application into the nose 2 (two) times daily.    Dispense:  22 g    Refill:  0    Order Specific Question:   Supervising Provider    Answer:   EURE, LUTHER H [[id number]]  . methylPREDNISolone (MEDROL DOSEPAK) 4 MG TBPK tablet    Sig: Take as directed    Dispense:  21 tablet    Refill:  0    Order Specific Question:   Supervising Provider    Answer:   Tania Ade H [[id number]]  Check CBC,CMP,TSH and lipids,A1c and vitamin D Follow up in 4 weeks to recheck lymph node Physical in 1 year, pap in 3 if normal Mammogram yearly Colonoscopy per GI F/U with Dr Buelah Manis prn

## 2016-09-09 LAB — LIPID PANEL
CHOLESTEROL TOTAL: 183 mg/dL (ref 100–199)
Chol/HDL Ratio: 4.2 ratio units (ref 0.0–4.4)
HDL: 44 mg/dL (ref >39)
LDL CALC: 127 mg/dL — AB (ref 0–99)
TRIGLYCERIDES: 59 mg/dL (ref 0–149)
VLDL CHOLESTEROL CAL: 12 mg/dL (ref 5–40)

## 2016-09-09 LAB — CBC
HEMATOCRIT: 45.8 % (ref 34.0–46.6)
Hemoglobin: 14.7 g/dL (ref 11.1–15.9)
MCH: 27.7 pg (ref 26.6–33.0)
MCHC: 32.1 g/dL (ref 31.5–35.7)
MCV: 86 fL (ref 79–97)
PLATELETS: 343 10*3/uL (ref 150–379)
RBC: 5.31 x10E6/uL — ABNORMAL HIGH (ref 3.77–5.28)
RDW: 14 % (ref 12.3–15.4)
WBC: 3.8 10*3/uL (ref 3.4–10.8)

## 2016-09-09 LAB — COMPREHENSIVE METABOLIC PANEL
ALK PHOS: 79 IU/L (ref 39–117)
ALT: 19 IU/L (ref 0–32)
AST: 20 IU/L (ref 0–40)
Albumin/Globulin Ratio: 1.6 (ref 1.2–2.2)
Albumin: 4.2 g/dL (ref 3.6–4.8)
BUN/Creatinine Ratio: 26 (ref 12–28)
BUN: 21 mg/dL (ref 8–27)
Bilirubin Total: 0.2 mg/dL (ref 0.0–1.2)
CALCIUM: 9.8 mg/dL (ref 8.7–10.3)
CO2: 24 mmol/L (ref 18–29)
CREATININE: 0.81 mg/dL (ref 0.57–1.00)
Chloride: 102 mmol/L (ref 96–106)
GFR calc Af Amer: 88 mL/min/{1.73_m2} (ref >59)
GFR, EST NON AFRICAN AMERICAN: 76 mL/min/{1.73_m2} (ref >59)
GLUCOSE: 98 mg/dL (ref 65–99)
Globulin, Total: 2.6 g/dL (ref 1.5–4.5)
Potassium: 4.4 mmol/L (ref 3.5–5.2)
SODIUM: 141 mmol/L (ref 134–144)
Total Protein: 6.8 g/dL (ref 6.0–8.5)

## 2016-09-09 LAB — CYTOLOGY - PAP
Diagnosis: NEGATIVE
HPV (WINDOPATH): NOT DETECTED

## 2016-09-09 LAB — VITAMIN D 25 HYDROXY (VIT D DEFICIENCY, FRACTURES): Vit D, 25-Hydroxy: 28.9 ng/mL — ABNORMAL LOW (ref 30.0–100.0)

## 2016-09-09 LAB — TSH: TSH: 0.838 u[IU]/mL (ref 0.450–4.500)

## 2016-09-09 LAB — HEMOGLOBIN A1C
ESTIMATED AVERAGE GLUCOSE: 117 mg/dL
Hgb A1c MFr Bld: 5.7 % — ABNORMAL HIGH (ref 4.8–5.6)

## 2016-09-12 ENCOUNTER — Telehealth: Payer: Self-pay | Admitting: Adult Health

## 2016-09-12 NOTE — Telephone Encounter (Signed)
Pt aware of pap and labs, increase vitamin D to 4000 IU daily

## 2016-10-06 ENCOUNTER — Ambulatory Visit: Payer: BLUE CROSS/BLUE SHIELD | Admitting: Adult Health

## 2016-12-01 ENCOUNTER — Encounter: Payer: Self-pay | Admitting: Family Medicine

## 2016-12-28 ENCOUNTER — Encounter: Payer: Self-pay | Admitting: Family Medicine

## 2017-01-11 ENCOUNTER — Other Ambulatory Visit: Payer: Self-pay | Admitting: Family Medicine

## 2017-02-23 ENCOUNTER — Telehealth: Payer: Self-pay | Admitting: *Deleted

## 2017-02-23 NOTE — Telephone Encounter (Signed)
Received call from patient.   Requested refill on Hydrocodone.   Ok to refill??  Last office visit 04/04/2016.  Last refill 06/29/2016.

## 2017-02-24 NOTE — Telephone Encounter (Signed)
Needs OV first!!

## 2017-02-24 NOTE — Telephone Encounter (Signed)
Call placed to patient and patient made aware.   Appointment scheduled.  

## 2017-03-02 ENCOUNTER — Encounter: Payer: Self-pay | Admitting: Family Medicine

## 2017-03-02 ENCOUNTER — Ambulatory Visit (INDEPENDENT_AMBULATORY_CARE_PROVIDER_SITE_OTHER): Payer: BLUE CROSS/BLUE SHIELD | Admitting: Family Medicine

## 2017-03-02 VITALS — BP 128/74 | HR 80 | Temp 98.4°F | Resp 14 | Ht 63.5 in | Wt 176.0 lb

## 2017-03-02 DIAGNOSIS — R7303 Prediabetes: Secondary | ICD-10-CM

## 2017-03-02 DIAGNOSIS — E782 Mixed hyperlipidemia: Secondary | ICD-10-CM

## 2017-03-02 DIAGNOSIS — Z23 Encounter for immunization: Secondary | ICD-10-CM | POA: Diagnosis not present

## 2017-03-02 DIAGNOSIS — E559 Vitamin D deficiency, unspecified: Secondary | ICD-10-CM

## 2017-03-02 DIAGNOSIS — M5136 Other intervertebral disc degeneration, lumbar region: Secondary | ICD-10-CM | POA: Diagnosis not present

## 2017-03-02 DIAGNOSIS — E6609 Other obesity due to excess calories: Secondary | ICD-10-CM

## 2017-03-02 DIAGNOSIS — Z6830 Body mass index (BMI) 30.0-30.9, adult: Secondary | ICD-10-CM | POA: Diagnosis not present

## 2017-03-02 DIAGNOSIS — M8589 Other specified disorders of bone density and structure, multiple sites: Secondary | ICD-10-CM

## 2017-03-02 DIAGNOSIS — E669 Obesity, unspecified: Secondary | ICD-10-CM | POA: Insufficient documentation

## 2017-03-02 DIAGNOSIS — M8580 Other specified disorders of bone density and structure, unspecified site: Secondary | ICD-10-CM | POA: Insufficient documentation

## 2017-03-02 LAB — BASIC METABOLIC PANEL
BUN: 16 mg/dL (ref 7–25)
CALCIUM: 9.2 mg/dL (ref 8.6–10.4)
CHLORIDE: 109 mmol/L (ref 98–110)
CO2: 22 mmol/L (ref 20–31)
CREATININE: 0.85 mg/dL (ref 0.50–0.99)
Glucose, Bld: 91 mg/dL (ref 70–99)
Potassium: 3.9 mmol/L (ref 3.5–5.3)
SODIUM: 141 mmol/L (ref 135–146)

## 2017-03-02 LAB — LIPID PANEL
Cholesterol: 163 mg/dL (ref <200)
HDL: 42 mg/dL — ABNORMAL LOW (ref >50)
LDL Cholesterol: 108 mg/dL — ABNORMAL HIGH (ref <100)
TRIGLYCERIDES: 63 mg/dL (ref <150)
Total CHOL/HDL Ratio: 3.9 Ratio (ref <5.0)
VLDL: 13 mg/dL (ref <30)

## 2017-03-02 MED ORDER — HYDROCODONE-ACETAMINOPHEN 5-325 MG PO TABS: 1.0000 | ORAL_TABLET | Freq: Four times a day (QID) | ORAL | 0 refills | Status: DC | PRN

## 2017-03-02 MED ORDER — CYCLOBENZAPRINE HCL 10 MG PO TABS: 10.0000 mg | ORAL_TABLET | Freq: Three times a day (TID) | ORAL | 3 refills | Status: DC | PRN

## 2017-03-02 NOTE — Assessment & Plan Note (Signed)
Refilled Norco and flexeril

## 2017-03-02 NOTE — Assessment & Plan Note (Signed)
Repeat Bone density and Vitamin D

## 2017-03-02 NOTE — Progress Notes (Signed)
   Subjective:    Patient ID: Karen Huynh, female    DOB: March 18, 1951, 66 y.o.   MRN: [id number]  Patient presents for Medication Review/ Refill (is fasting) Patient here to follow-up chronic medical problems and review medications. She is due for fasting labs for her hyperlipidemia she is on Zocor.  She is followed by GYN and had her physical exam done in January 2018, had fasting labs LDL wsa up and A1C was up to 5.7%, she admits to eating a lot, loves fried chicken, carbs, not as many veggies. Weight down 2lbs since August 2017.She is still working and trying to stay active  Chronic back pain due to DDD lumbar spine, sparingly uses norco, and flexeril, last script Norco in Nov 2017. Request refill today . History of vitamin D deficiency needs repeat levels and Repeat Bone density   Due for Prevnar 13  Review Of Systems:  GEN- denies fatigue, fever, weight loss,weakness, recent illness HEENT- denies eye drainage, change in vision, nasal discharge, CVS- denies chest pain, palpitations RESP- denies SOB, cough, wheeze ABD- denies N/V, change in stools, abd pain GU- denies dysuria, hematuria, dribbling, incontinence MSK- +joint pain, muscle aches, injury Neuro- denies headache, dizziness, syncope, seizure activity       Objective:    BP 128/74   Pulse 80   Temp 98.4 F (36.9 C) (Oral)   Resp 14   Ht 5' 3.5" (1.613 m)   Wt 176 lb (79.8 kg)   SpO2 98%   BMI 30.69 kg/m  GEN- NAD, alert and oriented x3 HEENT- PERRL, EOMI, non injected sclera, pink conjunctiva, MMM, oropharynx clear Neck- Supple, no thyromegaly CVS- RRR, no murmur RESP-CTAB ABD-NABS,soft,NT,ND EXT- No edema Pulses- Radial, DP- 2+        Assessment & Plan:      Problem List Items Addressed This Visit    Osteopenia    Repeat Bone density and Vitamin D      Relevant Orders   DG Bone Density   Obesity   Hyperlipidemia    Discussed dietary changes, cutting out fried foods, decreasing simple  carbs Increasing water and veggies Staying active Rechecking labs, continue zocor adjust dose goal about 100      Relevant Orders   Lipid panel   DDD (degenerative disc disease), lumbar    Refilled Norco and flexeril      Relevant Medications   HYDROcodone-acetaminophen (NORCO/VICODIN) 5-325 MG tablet   cyclobenzaprine (FLEXERIL) 10 MG tablet   Borderline diabetes mellitus - Primary    Recheck A1C      Relevant Orders   Basic metabolic panel   Hemoglobin A1c    Other Visit Diagnoses    Vitamin D deficiency       Relevant Orders   Vitamin D, 25-hydroxy      Note: This dictation was prepared with Dragon dictation along with smaller phrase technology. Any transcriptional errors that result from this process are unintentional.

## 2017-03-02 NOTE — Assessment & Plan Note (Signed)
Recheck A1C 

## 2017-03-02 NOTE — Addendum Note (Signed)
Addended by: Sheral Flow on: 03/02/2017 09:24 AM   Modules accepted: Orders

## 2017-03-02 NOTE — Patient Instructions (Signed)
F/U 6 months Physical  

## 2017-03-02 NOTE — Assessment & Plan Note (Signed)
Discussed dietary changes, cutting out fried foods, decreasing simple carbs Increasing water and veggies Staying active Rechecking labs, continue zocor adjust dose goal about 100

## 2017-03-03 LAB — HEMOGLOBIN A1C
Hgb A1c MFr Bld: 5.6 % (ref <5.7)
Mean Plasma Glucose: 114 mg/dL

## 2017-03-03 LAB — VITAMIN D 25 HYDROXY (VIT D DEFICIENCY, FRACTURES): Vit D, 25-Hydroxy: 30 ng/mL (ref 30–100)

## 2017-03-06 ENCOUNTER — Encounter: Payer: Self-pay | Admitting: *Deleted

## 2017-05-05 ENCOUNTER — Encounter: Payer: Self-pay | Admitting: Family Medicine

## 2017-05-18 ENCOUNTER — Inpatient Hospital Stay (HOSPITAL_COMMUNITY): Admission: RE | Admit: 2017-05-18 | Payer: BLUE CROSS/BLUE SHIELD | Source: Ambulatory Visit

## 2017-06-05 ENCOUNTER — Encounter: Payer: Self-pay | Admitting: Family Medicine

## 2017-07-03 ENCOUNTER — Encounter: Payer: Self-pay | Admitting: Family Medicine

## 2017-07-07 ENCOUNTER — Other Ambulatory Visit: Payer: Self-pay | Admitting: Family Medicine

## 2017-07-18 ENCOUNTER — Ambulatory Visit: Payer: BLUE CROSS/BLUE SHIELD | Admitting: Family Medicine

## 2017-07-18 ENCOUNTER — Other Ambulatory Visit: Payer: Self-pay

## 2017-07-18 ENCOUNTER — Encounter: Payer: Self-pay | Admitting: Family Medicine

## 2017-07-18 VITALS — BP 124/68 | HR 66 | Temp 98.7°F | Resp 16 | Ht 63.5 in | Wt 178.0 lb

## 2017-07-18 DIAGNOSIS — Z1239 Encounter for other screening for malignant neoplasm of breast: Secondary | ICD-10-CM

## 2017-07-18 DIAGNOSIS — Z0000 Encounter for general adult medical examination without abnormal findings: Secondary | ICD-10-CM

## 2017-07-18 DIAGNOSIS — Z1231 Encounter for screening mammogram for malignant neoplasm of breast: Secondary | ICD-10-CM

## 2017-07-18 DIAGNOSIS — E782 Mixed hyperlipidemia: Secondary | ICD-10-CM

## 2017-07-18 DIAGNOSIS — M5136 Other intervertebral disc degeneration, lumbar region: Secondary | ICD-10-CM

## 2017-07-18 DIAGNOSIS — Z1321 Encounter for screening for nutritional disorder: Secondary | ICD-10-CM

## 2017-07-18 DIAGNOSIS — E669 Obesity, unspecified: Secondary | ICD-10-CM

## 2017-07-18 DIAGNOSIS — Z23 Encounter for immunization: Secondary | ICD-10-CM | POA: Diagnosis not present

## 2017-07-18 MED ORDER — ZOSTER VAC RECOMB ADJUVANTED 50 MCG/0.5ML IM SUSR: 0.5000 mL | Freq: Once | INTRAMUSCULAR | 0 refills | Status: AC

## 2017-07-18 MED ORDER — CYCLOBENZAPRINE HCL 10 MG PO TABS: 10.0000 mg | ORAL_TABLET | Freq: Three times a day (TID) | ORAL | 3 refills | Status: DC | PRN

## 2017-07-18 MED ORDER — SIMVASTATIN 10 MG PO TABS: 10.0000 mg | ORAL_TABLET | Freq: Every day | ORAL | 2 refills | Status: DC

## 2017-07-18 MED ORDER — HYDROCODONE-ACETAMINOPHEN 5-325 MG PO TABS: 1.0000 | ORAL_TABLET | Freq: Four times a day (QID) | ORAL | 0 refills | Status: DC | PRN

## 2017-07-18 NOTE — Progress Notes (Signed)
   Subjective:    Patient ID: Karen Huynh, female    DOB: 03-21-1951, 66 y.o.   MRN: [id number]  Patient presents for Annual Exam (is fasting) She is here for CPE.  Medications reviewed. Lipidemia she is currently on simvastatin 10 mg daily Degenerative disc disease lumbar spine she does take hydrocodone as needed as well as Flexeril.  Last prescription given for hydrocodone was in July Ran out of cholesteorl medication 1 week ago  Due  Mammogram  Immunizations- Due for Shingles vaccine/FLu shot Pneumonia UTD, pneumovax 23 in 2019  COlonoscpy due in 2020- had 3 polyps   Had GYN exam done in Jan 2018- [place]   Has Dentist  Eye doctor- Dr. Jorja Loa- [facility]    Review Of Systems:  GEN- denies fatigue, fever, weight loss,weakness, recent illness HEENT- denies eye drainage, change in vision, nasal discharge, CVS- denies chest pain, palpitations RESP- denies SOB, cough, wheeze ABD- denies N/V, change in stools, abd pain GU- denies dysuria, hematuria, dribbling, incontinence MSK- denies joint pain, muscle aches, injury Neuro- denies headache, dizziness, syncope, seizure activity       Objective:    BP 124/68   Pulse 66   Temp 98.7 F (37.1 C) (Oral)   Resp 16   Ht 5' 3.5" (1.613 m)   Wt 178 lb (80.7 kg)   SpO2 97%   BMI 31.04 kg/m  GEN- NAD, alert and oriented x3 HEENT- PERRL, EOMI, non injected sclera, pink conjunctiva, MMM, oropharynx clear, TM clear bilat no effusion, nares clear  Neck- Supple, no thyromegaly CVS- RRR, no murmur RESP-CTAB ABD-NABS,soft,NT,ND EXT- No edema Pulses- Radial, DP- 2+        Assessment & Plan:      Problem List Items Addressed This Visit        Unprioritized   Hyperlipidemia   Relevant Medications   simvastatin (ZOCOR) 10 MG tablet   Other Relevant Orders   Lipid panel   DDD (degenerative disc disease), lumbar  Pain medication refilled    Relevant Medications   HYDROcodone-acetaminophen (NORCO/VICODIN) 5-325  MG tablet   cyclobenzaprine (FLEXERIL) 10 MG tablet    Other Visit Diagnoses    Routine general medical examination at a health care facility    -  Primary   CPE done, pt to schedule mammogram, flu shot done, send shingrix to pharmacy. Fasting labs to be done for cholesterol, vitamin D.[place]ge exercise, healthy eating  Weight discussed    Relevant Orders   CBC with Differential/Platelet   Comprehensive metabolic panel   Lipid panel   Breast cancer screening       Relevant Orders   MM SCREENING BREAST TOMO BILATERAL   Encounter for vitamin deficiency screening       Relevant Orders   Vitamin D, 25-hydroxy   Need for prophylactic vaccination and inoculation against influenza       Relevant Orders   Flu vaccine HIGH DOSE PF (Fluzone High dose) (Completed)      Note: This dictation was prepared with Dragon dictation along with smaller phrase technology. Any transcriptional errors that result from this process are unintentional.

## 2017-07-18 NOTE — Patient Instructions (Signed)
I recommend eye visit once a year I recommend dental visit every 6 months Goal is to  Exercise 30 minutes 5 days a week We will send a letter with lab results  F/U 6 months  

## 2017-07-19 LAB — COMPREHENSIVE METABOLIC PANEL
AG RATIO: 1.8 (calc) (ref 1.0–2.5)
ALT: 16 U/L (ref 6–29)
AST: 18 U/L (ref 10–35)
Albumin: 4.2 g/dL (ref 3.6–5.1)
Alkaline phosphatase (APISO): 62 U/L (ref 33–130)
BILIRUBIN TOTAL: 0.4 mg/dL (ref 0.2–1.2)
BUN: 18 mg/dL (ref 7–25)
CALCIUM: 9.7 mg/dL (ref 8.6–10.4)
CO2: 29 mmol/L (ref 20–32)
Chloride: 107 mmol/L (ref 98–110)
Creat: 0.66 mg/dL (ref 0.50–0.99)
GLUCOSE: 105 mg/dL — AB (ref 65–99)
Globulin: 2.4 g/dL (calc) (ref 1.9–3.7)
Potassium: 4.5 mmol/L (ref 3.5–5.3)
Sodium: 142 mmol/L (ref 135–146)
TOTAL PROTEIN: 6.6 g/dL (ref 6.1–8.1)

## 2017-07-19 LAB — CBC WITH DIFFERENTIAL/PLATELET
BASOS ABS: 41 {cells}/uL (ref 0–200)
Basophils Relative: 1.2 %
EOS ABS: 139 {cells}/uL (ref 15–500)
EOS PCT: 4.1 %
HCT: 41.7 % (ref 35.0–45.0)
HEMOGLOBIN: 13.7 g/dL (ref 11.7–15.5)
Lymphs Abs: 2050 cells/uL (ref 850–3900)
MCH: 28 pg (ref 27.0–33.0)
MCHC: 32.9 g/dL (ref 32.0–36.0)
MCV: 85.1 fL (ref 80.0–100.0)
MONOS PCT: 12.4 %
MPV: 10.5 fL (ref 7.5–12.5)
NEUTROS ABS: 748 {cells}/uL — AB (ref 1500–7800)
NEUTROS PCT: 22 %
Platelets: 235 10*3/uL (ref 140–400)
RBC: 4.9 10*6/uL (ref 3.80–5.10)
RDW: 12.7 % (ref 11.0–15.0)
Total Lymphocyte: 60.3 %
WBC mixed population: 422 cells/uL (ref 200–950)
WBC: 3.4 10*3/uL — ABNORMAL LOW (ref 3.8–10.8)

## 2017-07-19 LAB — LIPID PANEL
Cholesterol: 200 mg/dL — ABNORMAL HIGH (ref <200)
HDL: 47 mg/dL — AB (ref >50)
LDL CHOLESTEROL (CALC): 138 mg/dL — AB
Non-HDL Cholesterol (Calc): 153 mg/dL (calc) — ABNORMAL HIGH (ref <130)
TRIGLYCERIDES: 62 mg/dL (ref <150)
Total CHOL/HDL Ratio: 4.3 (calc) (ref <5.0)

## 2017-07-19 LAB — VITAMIN D 25 HYDROXY (VIT D DEFICIENCY, FRACTURES): Vit D, 25-Hydroxy: 27 ng/mL — ABNORMAL LOW (ref 30–100)

## 2017-07-25 ENCOUNTER — Encounter: Payer: Self-pay | Admitting: *Deleted

## 2017-07-25 ENCOUNTER — Other Ambulatory Visit: Payer: Self-pay | Admitting: *Deleted

## 2017-07-25 MED ORDER — SIMVASTATIN 20 MG PO TABS: 20.0000 mg | ORAL_TABLET | Freq: Every day | ORAL | 3 refills | Status: DC

## 2017-07-26 ENCOUNTER — Encounter: Payer: Self-pay | Admitting: Family Medicine

## 2017-07-27 ENCOUNTER — Telehealth: Payer: Self-pay | Admitting: *Deleted

## 2017-07-27 NOTE — Telephone Encounter (Signed)
Received request from pharmacy for PA on Hydrocodone.   PA submitted.   Dx: M51.36- DDD

## 2017-07-27 NOTE — Telephone Encounter (Signed)
Your PA has been faxed to the plan as a paper copy. Please contact the plan directly if you haven't received a determination in a typical timeframe.  You will be notified of the determination via fax. 

## 2017-07-28 NOTE — Telephone Encounter (Signed)
Received fax requesting MD signature for controlled substance.   Faxed back to insurance company.

## 2017-08-03 ENCOUNTER — Encounter: Payer: Self-pay | Admitting: *Deleted

## 2017-08-03 NOTE — Telephone Encounter (Signed)
Received PA determination.   PA denied.   Appeal faxed.  

## 2017-08-07 ENCOUNTER — Ambulatory Visit (HOSPITAL_COMMUNITY): Payer: BLUE CROSS/BLUE SHIELD

## 2017-08-10 ENCOUNTER — Ambulatory Visit (HOSPITAL_COMMUNITY)
Admission: RE | Admit: 2017-08-10 | Discharge: 2017-08-10 | Disposition: A | Discharge status: Home / Self Care | Payer: BLUE CROSS/BLUE SHIELD | Source: Ambulatory Visit | Attending: Family Medicine | Admitting: Family Medicine

## 2017-08-10 DIAGNOSIS — Z1239 Encounter for other screening for malignant neoplasm of breast: Secondary | ICD-10-CM

## 2017-08-10 DIAGNOSIS — Z1231 Encounter for screening mammogram for malignant neoplasm of breast: Secondary | ICD-10-CM | POA: Insufficient documentation

## 2017-08-10 NOTE — Telephone Encounter (Signed)
Received PA determination.   PA approved.  

## 2017-09-18 ENCOUNTER — Emergency Department (HOSPITAL_COMMUNITY)
Admission: EM | Admit: 2017-09-18 | Discharge: 2017-09-18 | Disposition: A | Discharge status: Home / Self Care | Payer: BLUE CROSS/BLUE SHIELD | Attending: Emergency Medicine | Admitting: Emergency Medicine

## 2017-09-18 ENCOUNTER — Other Ambulatory Visit: Payer: Self-pay

## 2017-09-18 ENCOUNTER — Emergency Department (HOSPITAL_COMMUNITY): Payer: BLUE CROSS/BLUE SHIELD

## 2017-09-18 DIAGNOSIS — Z79899 Other long term (current) drug therapy: Secondary | ICD-10-CM | POA: Insufficient documentation

## 2017-09-18 DIAGNOSIS — J069 Acute upper respiratory infection, unspecified: Secondary | ICD-10-CM | POA: Insufficient documentation

## 2017-09-18 DIAGNOSIS — F17210 Nicotine dependence, cigarettes, uncomplicated: Secondary | ICD-10-CM | POA: Diagnosis not present

## 2017-09-18 DIAGNOSIS — R05 Cough: Secondary | ICD-10-CM | POA: Diagnosis present

## 2017-09-18 MED ORDER — BENZONATATE 100 MG PO CAPS: 100.0000 mg | ORAL_CAPSULE | Freq: Three times a day (TID) | ORAL | 0 refills | Status: DC | PRN

## 2017-09-18 MED ORDER — OSELTAMIVIR PHOSPHATE 75 MG PO CAPS: 75.0000 mg | ORAL_CAPSULE | Freq: Two times a day (BID) | ORAL | 0 refills | Status: DC

## 2017-09-18 MED ORDER — ALBUTEROL SULFATE HFA 108 (90 BASE) MCG/ACT IN AERS: 2.0000 | INHALATION_SPRAY | RESPIRATORY_TRACT | 0 refills | Status: DC | PRN

## 2017-09-18 NOTE — Discharge Instructions (Signed)
Take the prescriptions as directed.  Use your albuterol inhaler (2 to 4 puffs) or your albuterol nebulizer (1 unit dose) every 4 hours for the next 7 days, then as needed for cough, wheezing, or shortness of breath. Take over the counter decongestant (such as sudafed), as directed on packaging, for the next week.  Use over the counter normal saline nasal spray, as instructed in the Emergency Department, several times per day for the next 2 weeks.  Call your regular medical doctor today to schedule a follow up appointment this week.  Return to the Emergency Department immediately if worsening.

## 2017-09-18 NOTE — ED Triage Notes (Signed)
Cough, congestion onset 3 days ago

## 2017-09-18 NOTE — ED Provider Notes (Signed)
[facility] EMERGENCY DEPARTMENT Provider Note   CSN: [id number] Arrival date & time: 09/18/17  1028     History   Chief Complaint No chief complaint on file.   HPI Karen Huynh is a 67 y.o. female.  HPI  Pt was seen at 1155.  Per pt, c/o gradual onset and persistence of constant runny/stuffy nose, sinus congestion, and cough for the past 3 days. Has been associated with "feeling achy."   Pt states she came to the ED "to make sure I didn't have pneumonia."  Denies sore throat, no fevers, no rash, no CP/SOB, no N/V/D, no abd pain.    Past Medical History:  Diagnosis Date  . Bronchitis   . Constipation   . H/O: rheumatic fever    as child  . Heart murmur   . High cholesterol   . History of pneumonia   . Leukopenia   . Leukopenia 06/17/2013  . Muscle cramps 08/12/2013  . OA (osteoarthritis of spine)   . Thoracic spine pain 09/03/2014    Patient Active Problem List   Diagnosis Date Noted  . Osteopenia 03/02/2017  . Obesity 03/02/2017  . Borderline diabetes mellitus 04/04/2016  . History of colonic polyps 08/13/2015  . Constipation by delayed colonic transit 08/13/2015  . DDD (degenerative disc disease), lumbar 02/03/2015  . DDD (degenerative disc disease), thoracolumbar 02/03/2015  . Hyperlipidemia 08/12/2013  . Leukopenia 06/17/2013    Past Surgical History:  Procedure Laterality Date  . COLONOSCOPY N/A 09/11/2015   Procedure: COLONOSCOPY;  Surgeon: Danie Binder, MD;  Location: [facility];  Service: Endoscopy;  Laterality: N/A;  1030 - moved to 12:00 - pt knows to arrive at 11:00  . COLONOSCOPY W/ BIOPSIES AND POLYPECTOMY  SEP 2010    SIMPLE ADENOMA(1)    OB History    Gravida Para Term Preterm AB Living   2 2       1    SAB TAB Ectopic Multiple Live Births           1       Home Medications    Prior to Admission medications   Medication Sig Start Date End Date Taking? Authorizing Provider  Ascorbic Acid (VITAMIN C) 1000 MG tablet Take 1,000 mg by  mouth daily.    [provider]  Cholecalciferol (VITAMIN D) 2000 UNITS CAPS Take 1 capsule by mouth daily.    [provider]  cyclobenzaprine (FLEXERIL) 10 MG tablet Take 1 tablet (10 mg total) 3 (three) times daily as needed by mouth for muscle spasms. 07/18/17   Alycia Rossetti, MD  HYDROcodone-acetaminophen (NORCO/VICODIN) 5-325 MG tablet Take 1 tablet every 6 (six) hours as needed by mouth for moderate pain. 07/18/17   Alycia Rossetti, MD  simvastatin (ZOCOR) 20 MG tablet Take 1 tablet (20 mg total) by mouth daily. 07/25/17   Alycia Rossetti, MD    Family History Family History  Problem Relation Age of Onset  . Colon cancer Mother   . Cancer Mother   . Aneurysm Father   . Cancer Maternal Aunt   . Cancer Other        niece with breast cancer  . Cancer Brother        prostate  . Kidney failure Maternal Grandmother   . Dementia Maternal Grandfather   . Congestive Heart Failure Sister   . COPD Sister   . Hypertension Sister   . Asthma Brother   . Dementia Maternal Uncle   .  Dementia Maternal Aunt   . Other Son        MVA  . Colon polyps Neg Hx     Social History Social History   Tobacco Use  . Smoking status: Current Every Day Smoker    Packs/day: 0.00    Years: 47.00    Pack years: 0.00    Types: Cigarettes  . Smokeless tobacco: Never Used  . Tobacco comment: smokes 2 cig daily  Substance Use Topics  . Alcohol use: No  . Drug use: No     Allergies   Penicillins   Review of Systems Review of Systems ROS: Statement: All systems negative except as marked or noted in the HPI; Constitutional: Negative for fever and chills. +body aches.; ; Eyes: Negative for eye pain, redness and discharge. ; ; ENMT: Negative for ear pain, hoarseness, sore throat. +nasal congestion, sinus pressure and rhinorrhea. ; ; Cardiovascular: Negative for chest pain, palpitations, diaphoresis, dyspnea and peripheral edema. ; ; Respiratory: +cough. Negative for wheezing  and stridor. ; ; Gastrointestinal: Negative for nausea, vomiting, diarrhea, abdominal pain, blood in stool, hematemesis, jaundice and rectal bleeding. . ; ; Genitourinary: Negative for dysuria, flank pain and hematuria. ; ; Musculoskeletal: Negative for back pain and neck pain. Negative for swelling and trauma.; ; Skin: Negative for pruritus, rash, abrasions, blisters, bruising and skin lesion.; ; Neuro: Negative for headache, lightheadedness and neck stiffness. Negative for weakness, altered level of consciousness, altered mental status, extremity weakness, paresthesias, involuntary movement, seizure and syncope.       Physical Exam Updated Vital Signs BP 138/75 (BP Location: Left Arm)   Pulse 75   Temp 98.1 F (36.7 C) (Oral)   Resp 16   Ht 5\' 6"  (1.676 m)   SpO2 100%   BMI 28.73 kg/m   Physical Exam 1200: Physical examination:  Nursing notes reviewed; Vital signs and O2 SAT reviewed;  Constitutional: Well developed, Well nourished, Well hydrated, In no acute distress; Head:  Normocephalic, atraumatic; Eyes: EOMI, PERRL, No scleral icterus; ENMT: TM's clear bilat. +edemetous nasal turbinates bilat with clear rhinorrhea. Mouth and pharynx without lesions. No tonsillar exudates. No intra-oral edema. No submandibular or sublingual edema. No hoarse voice, no drooling, no stridor. No pain with manipulation of larynx. No trismus.  Mouth and pharynx normal, Mucous membranes moist; Neck: Supple, Full range of motion, No lymphadenopathy; Cardiovascular: Regular rate and rhythm, No gallop; Respiratory: Breath sounds clear & equal bilaterally, No wheezes.  Speaking full sentences with ease, Normal respiratory effort/excursion; Chest: Nontender, Movement normal; Abdomen: Soft, Nontender, Nondistended, Normal bowel sounds; Genitourinary: No CVA tenderness; Extremities: Pulses normal, No tenderness, No edema, No calf edema or asymmetry.; Neuro: AA&Ox3, Major CN grossly intact. No facial droop. Speech clear.  No gross focal motor or sensory deficits in extremities.; Skin: Color normal, Warm, Dry.    ED Treatments / Results  Labs (all labs ordered are listed, but only abnormal results are displayed)   EKG  EKG Interpretation None       Radiology   Procedures Procedures (including critical care time)  Medications Ordered in ED Medications - No data to display   Initial Impression / Assessment and Plan / ED Course  I have reviewed the triage vital signs and the nursing notes.  Pertinent labs & imaging results that were available during my care of the patient were reviewed by me and considered in my medical decision making (see chart for details).  MDM Reviewed: previous chart, nursing note and vitals Interpretation: x-ray  Dg Chest 2 View Result Date: 09/18/2017 CLINICAL DATA:  Productive cough EXAM: CHEST  2 VIEW COMPARISON:  09/03/2016. FINDINGS: Previously seen peribronchial thickening and interstitial prominence has improved. No confluent opacities. No effusions. Heart is normal size. No acute bony abnormality. IMPRESSION: Improving/resolved bronchitis. No acute findings. Electronically Signed   By: Rolm Baptise M.D.   On: 09/18/2017 11:26    1205:  XR reassuring. Tx symptomatically at this time. Dx and testing d/w pt.  Questions answered.  Verb understanding, agreeable to d/c home with outpt f/u.   Final Clinical Impressions(s) / ED Diagnoses   Final diagnoses:  None    ED Discharge Orders    None       Francine Graven, DO 09/20/17 1522

## 2017-10-10 ENCOUNTER — Ambulatory Visit (INDEPENDENT_AMBULATORY_CARE_PROVIDER_SITE_OTHER): Payer: BLUE CROSS/BLUE SHIELD | Admitting: Family Medicine

## 2017-10-10 ENCOUNTER — Encounter: Payer: Self-pay | Admitting: Family Medicine

## 2017-10-10 ENCOUNTER — Other Ambulatory Visit: Payer: Self-pay

## 2017-10-10 VITALS — BP 128/62 | HR 86 | Temp 98.6°F | Resp 16 | Ht 63.5 in | Wt 179.0 lb

## 2017-10-10 DIAGNOSIS — J0100 Acute maxillary sinusitis, unspecified: Secondary | ICD-10-CM | POA: Diagnosis not present

## 2017-10-10 LAB — INFLUENZA A AND B AG, IMMUNOASSAY
INFLUENZA A ANTIGEN: NOT DETECTED
INFLUENZA B ANTIGEN: NOT DETECTED

## 2017-10-10 MED ORDER — CEFDINIR 300 MG PO CAPS: 300.0000 mg | ORAL_CAPSULE | Freq: Two times a day (BID) | ORAL | 0 refills | Status: DC

## 2017-10-10 MED ORDER — GUAIFENESIN-CODEINE 100-10 MG/5ML PO SOLN: 5.0000 mL | Freq: Four times a day (QID) | ORAL | 0 refills | Status: DC | PRN

## 2017-10-10 MED ORDER — FLUCONAZOLE 150 MG PO TABS: 150.0000 mg | ORAL_TABLET | Freq: Once | ORAL | 1 refills | Status: AC

## 2017-10-10 NOTE — Progress Notes (Signed)
   Subjective:    Patient ID: Karen Huynh, female    DOB: 05/20/1951, 67 y.o.   MRN: [id number]  Patient presents for Illness (x2 days- fever, productive cough, sinus pressure, HA)  Started with head cold on Sunday, started with cough with mild production on today, headache sinus pressure worse., No fever. No vomiting or diarrhea, occasionally has some wheeze at night but has not used the inhaler  Took Mucinex and dayquil   Using Afrin for decongesting No flu contacts she aware of  In Jan given Tamiflu, tessalon, inhlaer  From ER, CXR showed acute bronchitis       Review Of Systems:  GEN- denies fatigue, fever, weight loss,weakness, recent illness HEENT- denies eye drainage, change in vision, +nasal discharge, CVS- denies chest pain, palpitations RESP- denies SOB,+ cough, wheeze ABD- denies N/V, change in stools, abd pain GU- denies dysuria, hematuria, dribbling, incontinence MSK- denies joint pain, muscle aches, injury Neuro- denies headache, dizziness, syncope, seizure activity       Objective:    BP 128/62   Pulse 86   Temp 98.6 F (37 C) (Oral)   Resp 16   Ht 5' 3.5" (1.613 m)   Wt 179 lb (81.2 kg)   SpO2 98%   BMI 31.21 kg/m  GEN- NAD, alert and oriented x3 HEENT- PERRL, EOMI, non injected sclera, pink conjunctiva, MMM, oropharynx mild injection, TM clear bilat no effusion,  + maxillary/Frontal  sinus tenderness, inflammed turbinates,  Nasal drainage  Neck- Supple, no LAD CVS- RRR, no murmur RESP-CTAB Pulses- Radial 2+          Assessment & Plan:      Problem List Items Addressed This Visit    None    Visit Diagnoses    Acute maxillary sinusitis, recurrence not specified    -  Primary   Omnicef, nasal saline, robitussin codiene. no sign of PNA, flu neg   Relevant Medications   fluconazole (DIFLUCAN) 150 MG tablet   cefdinir (OMNICEF) 300 MG capsule   guaiFENesin-codeine 100-10 MG/5ML syrup   Other Relevant Orders   Influenza A and B Ag,  Immunoassay      Note: This dictation was prepared with Dragon dictation along with smaller phrase technology. Any transcriptional errors that result from this process are unintentional.

## 2017-10-10 NOTE — Patient Instructions (Addendum)
Nasal saline/Ocean Spray Use cough medicine  Take antibiotics  F/U as needed

## 2017-11-29 ENCOUNTER — Encounter (INDEPENDENT_AMBULATORY_CARE_PROVIDER_SITE_OTHER): Payer: Self-pay

## 2017-11-29 ENCOUNTER — Encounter: Payer: Self-pay | Admitting: Adult Health

## 2017-11-29 ENCOUNTER — Other Ambulatory Visit: Payer: Self-pay

## 2017-11-29 ENCOUNTER — Ambulatory Visit (INDEPENDENT_AMBULATORY_CARE_PROVIDER_SITE_OTHER): Payer: BLUE CROSS/BLUE SHIELD | Admitting: Adult Health

## 2017-11-29 VITALS — BP 130/84 | HR 68 | Resp 18 | Ht 64.0 in | Wt 181.0 lb

## 2017-11-29 DIAGNOSIS — L918 Other hypertrophic disorders of the skin: Secondary | ICD-10-CM

## 2017-11-29 DIAGNOSIS — Z01411 Encounter for gynecological examination (general) (routine) with abnormal findings: Secondary | ICD-10-CM

## 2017-11-29 DIAGNOSIS — E7800 Pure hypercholesterolemia, unspecified: Secondary | ICD-10-CM

## 2017-11-29 DIAGNOSIS — R7309 Other abnormal glucose: Secondary | ICD-10-CM | POA: Diagnosis not present

## 2017-11-29 DIAGNOSIS — Z1211 Encounter for screening for malignant neoplasm of colon: Secondary | ICD-10-CM | POA: Diagnosis not present

## 2017-11-29 DIAGNOSIS — Z1329 Encounter for screening for other suspected endocrine disorder: Secondary | ICD-10-CM

## 2017-11-29 DIAGNOSIS — Z1212 Encounter for screening for malignant neoplasm of rectum: Secondary | ICD-10-CM

## 2017-11-29 DIAGNOSIS — Z01419 Encounter for gynecological examination (general) (routine) without abnormal findings: Secondary | ICD-10-CM

## 2017-11-29 LAB — HEMOCCULT GUIAC POC 1CARD (OFFICE): FECAL OCCULT BLD: NEGATIVE

## 2017-11-29 NOTE — Progress Notes (Signed)
Patient ID: Karen Huynh, female   DOB: 06-Mar-1951, 67 y.o.   MRN: [id number] History of Present Illness: Karen Huynh is a 67 year old black female in for a well woman gyn exam,had normal pap with negative HPV 09/08/16. PCP is Dr Buelah Manis.    Current Medications, Allergies, Past Medical History, Past Surgical History, Family History and Social History were reviewed in [facility] record.     Review of Systems: Patient denies any headaches, hearing loss, fatigue, blurred vision, shortness of breath, chest pain, abdominal pain, problems with bowel movements, urination, or intercourse. No joint pain or mood swings.    Physical Exam:BP 130/84 (BP Location: Right Arm, Patient Position: Sitting, Cuff Size: Normal)   Pulse 68   Resp 18   Ht 5\' 4"  (1.626 m)   Wt 181 lb (82.1 kg)   BMI 31.07 kg/m  General:  Well developed, well nourished, no acute distress Skin:  Warm and dry Neck:  Midline trachea, normal thyroid, good ROM, no lymphadenopathy,no carotid bruits heard Lungs; Clear to auscultation bilaterally Breast:  No dominant palpable mass, retraction, or nipple discharge Cardiovascular: Regular rate and rhythm Abdomen:  Soft, non tender, no hepatosplenomegaly Pelvic:  External genitalia is normal in appearance, no lesions.  The vagina is normal in appearance. Urethra has no lesions or masses. The cervix is smooth..  Uterus is felt to be normal size, shape, and contour.  No adnexal masses or tenderness noted.Bladder is non tender, no masses felt. Rectal: Good sphincter tone, no polyps, or hemorrhoids felt.  Hemoccult negative. Extremities/musculoskeletal:  No swelling or varicosities noted, no clubbing or cyanosis Psych:  No mood changes, alert and cooperative,seems happy PHQ 2 score 0.  Impression:  1. Encounter for well woman exam with routine gynecological exam   2. Screening for colorectal cancer   3. Skin tag   4. Elevated hemoglobin A1c   5. Elevated LDL  cholesterol level   6. Screening for thyroid disorder      Plan: Check CBC,CMP,TSH and lipids,A1c Physical in 1 year Pap in 2021 Mammogram yearly Colonoscopy 2020

## 2017-11-30 LAB — COMPREHENSIVE METABOLIC PANEL
A/G RATIO: 2.3 — AB (ref 1.2–2.2)
ALT: 22 IU/L (ref 0–32)
AST: 20 IU/L (ref 0–40)
Albumin: 4.6 g/dL (ref 3.6–4.8)
Alkaline Phosphatase: 67 IU/L (ref 39–117)
BILIRUBIN TOTAL: 0.3 mg/dL (ref 0.0–1.2)
BUN/Creatinine Ratio: 18 (ref 12–28)
BUN: 15 mg/dL (ref 8–27)
CHLORIDE: 106 mmol/L (ref 96–106)
CO2: 23 mmol/L (ref 20–29)
Calcium: 10 mg/dL (ref 8.7–10.3)
Creatinine, Ser: 0.82 mg/dL (ref 0.57–1.00)
GFR calc non Af Amer: 75 mL/min/{1.73_m2} (ref >59)
GFR, EST AFRICAN AMERICAN: 86 mL/min/{1.73_m2} (ref >59)
Globulin, Total: 2 g/dL (ref 1.5–4.5)
Glucose: 95 mg/dL (ref 65–99)
POTASSIUM: 5 mmol/L (ref 3.5–5.2)
Sodium: 143 mmol/L (ref 134–144)
TOTAL PROTEIN: 6.6 g/dL (ref 6.0–8.5)

## 2017-11-30 LAB — LIPID PANEL
CHOL/HDL RATIO: 3.5 ratio (ref 0.0–4.4)
Cholesterol, Total: 161 mg/dL (ref 100–199)
HDL: 46 mg/dL (ref >39)
LDL Calculated: 106 mg/dL — ABNORMAL HIGH (ref 0–99)
TRIGLYCERIDES: 45 mg/dL (ref 0–149)
VLDL Cholesterol Cal: 9 mg/dL (ref 5–40)

## 2017-11-30 LAB — TSH: TSH: 1.05 u[IU]/mL (ref 0.450–4.500)

## 2017-11-30 LAB — CBC
Hematocrit: 43.2 % (ref 34.0–46.6)
Hemoglobin: 14.3 g/dL (ref 11.1–15.9)
MCH: 27.7 pg (ref 26.6–33.0)
MCHC: 33.1 g/dL (ref 31.5–35.7)
MCV: 84 fL (ref 79–97)
PLATELETS: 247 10*3/uL (ref 150–379)
RBC: 5.17 x10E6/uL (ref 3.77–5.28)
RDW: 14 % (ref 12.3–15.4)
WBC: 3.4 10*3/uL (ref 3.4–10.8)

## 2017-11-30 LAB — HEMOGLOBIN A1C
ESTIMATED AVERAGE GLUCOSE: 120 mg/dL
Hgb A1c MFr Bld: 5.8 % — ABNORMAL HIGH (ref 4.8–5.6)

## 2017-12-06 ENCOUNTER — Telehealth: Payer: Self-pay | Admitting: Adult Health

## 2017-12-06 NOTE — Telephone Encounter (Signed)
Left message about labs and to cut carbs and stay active, doing great

## 2018-01-30 ENCOUNTER — Other Ambulatory Visit: Payer: Self-pay

## 2018-01-30 ENCOUNTER — Ambulatory Visit: Payer: BLUE CROSS/BLUE SHIELD | Admitting: Family Medicine

## 2018-01-30 ENCOUNTER — Encounter: Payer: Self-pay | Admitting: Family Medicine

## 2018-01-30 VITALS — BP 128/70 | HR 74 | Temp 98.4°F | Resp 16 | Ht 64.0 in | Wt 181.0 lb

## 2018-01-30 DIAGNOSIS — E782 Mixed hyperlipidemia: Secondary | ICD-10-CM | POA: Diagnosis not present

## 2018-01-30 DIAGNOSIS — R7303 Prediabetes: Secondary | ICD-10-CM

## 2018-01-30 DIAGNOSIS — M5136 Other intervertebral disc degeneration, lumbar region: Secondary | ICD-10-CM

## 2018-01-30 MED ORDER — CYCLOBENZAPRINE HCL 10 MG PO TABS: 10.0000 mg | ORAL_TABLET | Freq: Three times a day (TID) | ORAL | 3 refills | Status: DC | PRN

## 2018-01-30 MED ORDER — SIMVASTATIN 20 MG PO TABS: 20.0000 mg | ORAL_TABLET | Freq: Every day | ORAL | 3 refills | Status: DC

## 2018-01-30 MED ORDER — HYDROCODONE-ACETAMINOPHEN 5-325 MG PO TABS: 1.0000 | ORAL_TABLET | Freq: Four times a day (QID) | ORAL | 0 refills | Status: DC | PRN

## 2018-01-30 NOTE — Progress Notes (Signed)
   Subjective:    Patient ID: Karen Huynh, female    DOB: [birth year], 67 y.o.   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    Hyperlipidemia- taking zocor , no side effects    Taking an immune vitamin OTC , no side effects   Reviewed fasting labs from April in detail at bedside  Review Of Systems:  GEN- denies fatigue, fever, weight loss,weakness, recent illness HEENT- denies eye drainage, change in vision, nasal discharge, CVS- denies chest pain, palpitations RESP- denies SOB, cough, wheeze ABD- denies N/V, change in stools, abd pain GU- denies dysuria, hematuria, dribbling, incontinence MSK- + joint pain, muscle aches, injury Neuro- denies headache, dizziness, syncope, seizure activity       Objective:    BP 128/70   Pulse 74   Temp 98.4 F (36.9 C) (Oral)   Resp 16   Ht 5\' 4"  (1.626 m)   Wt 181 lb (82.1 kg)   SpO2 98%   BMI 31.07 kg/m  GEN- NAD, alert and oriented x3 HEENT- PERRL, EOMI, non injected sclera, pink conjunctiva, MMM, oropharynx clear CVS- RRR, no murmur RESP-CTAB EXT- No edema Pulses- Radial, DP- 2+        Assessment & Plan:      Problem List Items Addressed This Visit      Unprioritized   Borderline diabetes mellitus    Discussed monitoring sweets/carbs Reviewed labs from GYN from April       DDD (degenerative disc disease), lumbar    Continue pain meds, flexeril prn Staying active       Relevant Medications   HYDROcodone-acetaminophen (NORCO/VICODIN) 5-325 MG tablet   cyclobenzaprine (FLEXERIL) 10 MG tablet   Hyperlipidemia - Primary    Discussed diet, continue zocor, LDL at goal for her comorbidites      Relevant Medications   simvastatin (ZOCOR) 20 MG tablet      Note: This dictation was prepared with Dragon dictation along with smaller phrase  technology. Any transcriptional errors that result from this process are unintentional.

## 2018-01-30 NOTE — Patient Instructions (Addendum)
F/U End Nov for Physical

## 2018-01-30 NOTE — Assessment & Plan Note (Signed)
Discussed diet, continue zocor, LDL at goal for her comorbidites

## 2018-01-30 NOTE — Assessment & Plan Note (Signed)
Discussed monitoring sweets/carbs Reviewed labs from GYN from April

## 2018-01-30 NOTE — Assessment & Plan Note (Signed)
Continue pain meds, flexeril prn Staying active

## 2018-06-13 ENCOUNTER — Ambulatory Visit (INDEPENDENT_AMBULATORY_CARE_PROVIDER_SITE_OTHER): Payer: BLUE CROSS/BLUE SHIELD | Admitting: Family Medicine

## 2018-06-13 VITALS — BP 162/86

## 2018-06-13 DIAGNOSIS — Z23 Encounter for immunization: Secondary | ICD-10-CM

## 2018-06-13 NOTE — Progress Notes (Signed)
Pt here for a BP check and it was 162/86. Pt states that she has a electronic arm cuff at home and the other morning it was 190/101 and she wanted to make sure it was right. I told her she could bring it by here and we could check it against [place] however she needs to keep check on her BP's a few times per day and if they remain 140/90 or above then she needed to make an appointment to see one of the providers. Pt verbalized understanding.

## 2018-06-21 ENCOUNTER — Encounter (HOSPITAL_COMMUNITY): Payer: Self-pay | Admitting: *Deleted

## 2018-06-21 ENCOUNTER — Emergency Department (HOSPITAL_COMMUNITY)
Admission: EM | Admit: 2018-06-21 | Discharge: 2018-06-21 | Disposition: A | Discharge status: Home / Self Care | Payer: BLUE CROSS/BLUE SHIELD | Attending: Emergency Medicine | Admitting: Emergency Medicine

## 2018-06-21 DIAGNOSIS — I10 Essential (primary) hypertension: Secondary | ICD-10-CM | POA: Diagnosis present

## 2018-06-21 DIAGNOSIS — F17210 Nicotine dependence, cigarettes, uncomplicated: Secondary | ICD-10-CM | POA: Insufficient documentation

## 2018-06-21 DIAGNOSIS — Z79899 Other long term (current) drug therapy: Secondary | ICD-10-CM | POA: Insufficient documentation

## 2018-06-21 LAB — I-STAT CHEM 8, ED
BUN: 9 mg/dL (ref 8–23)
CALCIUM ION: 1.27 mmol/L (ref 1.15–1.40)
Chloride: 102 mmol/L (ref 98–111)
Creatinine, Ser: 0.7 mg/dL (ref 0.44–1.00)
GLUCOSE: 87 mg/dL (ref 70–99)
HCT: 43 % (ref 36.0–46.0)
HEMOGLOBIN: 14.6 g/dL (ref 12.0–15.0)
POTASSIUM: 3.8 mmol/L (ref 3.5–5.1)
SODIUM: 139 mmol/L (ref 135–145)
TCO2: 26 mmol/L (ref 22–32)

## 2018-06-21 MED ORDER — ACETAMINOPHEN 500 MG PO TABS
1000.0000 mg | ORAL_TABLET | Freq: Once | ORAL | Status: AC
  Administered 2018-06-21: 1000 mg via ORAL
  Filled 2018-06-21: qty 2

## 2018-06-21 NOTE — ED Provider Notes (Signed)
[facility] EMERGENCY DEPARTMENT Provider Note   CSN: [id number] Arrival date & time: 06/21/18  1707     History   Chief Complaint Chief Complaint  Patient presents with  . Hypertension    HPI Karen Huynh is a 67 y.o. female presenting for evaluation of elevated blood pressures.  She denies prior history of htn but has been taking her bp's at home in recent weeks and has been recording blood pressures between 130/80 to a high of 179/86 last week. She denies headache, but endorses a mild bandlike like pressure sensation along with  posterior neck and shoulder soreness which she is concerned might be a side effect of elevated bp's. She denies vision changes (for distance) but for reading has had increased blurriness.  She denies chest pain, sob, abdominal pain, n/v and no ankle edema.  She has had no focal weakness or dizziness.  She has had no treatment prior to arrival. She is scheduled to see her pcp in 5 days for evaluation of her blood pressure.  The history is provided by the patient.    Past Medical History:  Diagnosis Date  . Bronchitis   . Constipation   . H/O: rheumatic fever    as child  . Heart murmur   . High cholesterol   . History of pneumonia   . Leukopenia   . Leukopenia 06/17/2013  . Muscle cramps 08/12/2013  . OA (osteoarthritis of spine)   . Thoracic spine pain 09/03/2014    Patient Active Problem List   Diagnosis Date Noted  . Encounter for well woman exam with routine gynecological exam 11/29/2017  . Osteopenia 03/02/2017  . Obesity 03/02/2017  . Borderline diabetes mellitus 04/04/2016  . History of colonic polyps 08/13/2015  . Constipation by delayed colonic transit 08/13/2015  . DDD (degenerative disc disease), lumbar 02/03/2015  . DDD (degenerative disc disease), thoracolumbar 02/03/2015  . Hyperlipidemia 08/12/2013  . Leukopenia 06/17/2013    Past Surgical History:  Procedure Laterality Date  . COLONOSCOPY N/A 09/11/2015   Procedure:  COLONOSCOPY;  Surgeon: Danie Binder, MD;  Location: [facility];  Service: Endoscopy;  Laterality: N/A;  1030 - moved to 12:00 - pt knows to arrive at 11:00  . COLONOSCOPY W/ BIOPSIES AND POLYPECTOMY  SEP 2010    SIMPLE ADENOMA(1)     OB History    Gravida  2   Para  2   Term      Preterm      AB      Living  1     SAB      TAB      Ectopic      Multiple      Live Births  1            Home Medications    Prior to Admission medications   Medication Sig Start Date End Date Taking? Authorizing Provider  albuterol (PROVENTIL HFA;VENTOLIN HFA) 108 (90 Base) MCG/ACT inhaler Inhale 2 puffs into the lungs every 4 (four) hours as needed for wheezing or shortness of breath. Patient not taking: Reported on 01/30/2018 09/18/17   Francine Graven, DO  Ascorbic Acid (VITAMIN C) 1000 MG tablet Take 1,000 mg by mouth daily.    [provider]  Cholecalciferol (VITAMIN D) 2000 UNITS CAPS Take 1 capsule by mouth daily.    [provider]  cyclobenzaprine (FLEXERIL) 10 MG tablet Take 1 tablet (10 mg total) by mouth 3 (three) times daily as needed for  muscle spasms. 01/30/18   Alycia Rossetti, MD  HYDROcodone-acetaminophen (NORCO/VICODIN) 5-325 MG tablet Take 1 tablet by mouth every 6 (six) hours as needed for moderate pain. 01/30/18   Alycia Rossetti, MD  simvastatin (ZOCOR) 20 MG tablet Take 1 tablet (20 mg total) by mouth daily. 01/30/18   Alycia Rossetti, MD    Family History Family History  Problem Relation Age of Onset  . Colon cancer Mother   . Cancer Mother   . Aneurysm Father   . Cancer Maternal Aunt   . Cancer Other        niece with breast cancer  . Cancer Brother        prostate  . Kidney failure Maternal Grandmother   . Dementia Maternal Grandfather   . Congestive Heart Failure Sister   . COPD Sister   . Hypertension Sister   . Asthma Brother   . Dementia Maternal Uncle   . Dementia Maternal Aunt   . Other Son        MVA  . Colon  polyps Neg Hx     Social History Social History   Tobacco Use  . Smoking status: Current Every Day Smoker    Packs/day: 0.00    Years: 47.00    Pack years: 0.00    Types: Cigarettes  . Smokeless tobacco: Never Used  . Tobacco comment: smokes 2 cig daily  Substance Use Topics  . Alcohol use: No  . Drug use: No     Allergies   Penicillins   Review of Systems Review of Systems  Constitutional: Negative for fever.  HENT: Negative for congestion and sore throat.   Eyes: Positive for visual disturbance.  Respiratory: Negative for chest tightness and shortness of breath.   Cardiovascular: Negative for chest pain, palpitations and leg swelling.  Gastrointestinal: Negative for abdominal pain, nausea and vomiting.  Genitourinary: Negative.   Musculoskeletal: Negative for arthralgias, joint swelling, neck pain and neck stiffness.  Skin: Negative.  Negative for rash and wound.  Neurological: Negative for dizziness, weakness, light-headedness, numbness and headaches.  Psychiatric/Behavioral: Negative.      Physical Exam Updated Vital Signs BP (!) 146/75   Pulse 65   Temp 98 F (36.7 C) (Oral)   Resp 16   Ht 5\' 6"  (1.676 m)   Wt 84.4 kg   SpO2 97%   BMI 30.02 kg/m   Physical Exam  Constitutional: She appears well-developed and well-nourished.  HENT:  Head: Normocephalic and atraumatic.  Note tenderness to palpation over temporal bones or temporal arteries.  Eyes: Conjunctivae are normal.  Visual Acuity Bilateral Near: 20/20  Bilateral Distance: 20/20  R Near: 20/20  R Distance: 20/20  L Near: 20/20  L Distance: 20/20     Neck: Normal range of motion.  Cardiovascular: Normal rate, regular rhythm, normal heart sounds and intact distal pulses.  Pulmonary/Chest: Effort normal and breath sounds normal. She has no wheezes.  Abdominal: Soft. Bowel sounds are normal. There is no tenderness.  Musculoskeletal: Normal range of motion. She exhibits tenderness. She  exhibits no edema.  Mild soreness to the musculature across patient's bilateral shoulders and into the bilateral soft tissue neck.  There is no palpable spasm, no decreased range of motion.  Neurological: She is alert.  Skin: Skin is warm and dry.  Psychiatric: She has a normal mood and affect.  Nursing note and vitals reviewed.    ED Treatments / Results  Labs (all labs ordered are listed, but only abnormal  results are displayed) Labs Reviewed  I-STAT CHEM 8, ED    EKG None  Radiology No results found.  Procedures Procedures (including critical care time)  Medications Ordered in ED Medications  acetaminophen (TYLENOL) tablet 1,000 mg (1,000 mg Oral Given 06/21/18 1844)     Initial Impression / Assessment and Plan / ED Course  I have reviewed the triage vital signs and the nursing notes.  Pertinent labs & imaging results that were available during my care of the patient were reviewed by me and considered in my medical decision making (see chart for details).     Patient with multiple consistent elevated blood pressures per documentation she brings from home and with tonight's measurements.  These numbers are not suggesting of hypertensive urgency.  Her exam is unremarkable with no neuro deficits.  She does have some soreness across her shoulders up into her neck suggesting her head and scalp discomfort may be from tension headache.  She was given information about the DASH diet as a first step for blood pressure control.She was advised to keep her appointment with Dr. Dennard Schaumann on Tuesday as planned.  No chest pain or shortness of breath.  Lab test confirming normal creatinine level, no evidence for endorgan damage.  Reassurance was given.  Final Clinical Impressions(s) / ED Diagnoses   Final diagnoses:  Essential hypertension    ED Discharge Orders    None       Landis Martins 06/22/18 0116    Julianne Rice, MD 06/24/18 [phone]

## 2018-06-21 NOTE — Discharge Instructions (Addendum)
Your lab tests today and your exam are both reassuring that you are having no symptoms suggesting your elevated blood pressure as the source.  I have included a DASH eating plan for your information on ways to improve your blood pressure without medications with salt reduction as the first step in this process.  Keep your appointment with Dr. Dennard Schaumann on Tuesday as planned.

## 2018-06-21 NOTE — ED Triage Notes (Signed)
States her blood pressure is up,, states she feels like her head is tight

## 2018-06-26 ENCOUNTER — Encounter: Payer: Self-pay | Admitting: Family Medicine

## 2018-06-26 ENCOUNTER — Ambulatory Visit (INDEPENDENT_AMBULATORY_CARE_PROVIDER_SITE_OTHER): Payer: BLUE CROSS/BLUE SHIELD | Admitting: Family Medicine

## 2018-06-26 VITALS — BP 152/78 | HR 68 | Temp 98.0°F | Resp 16 | Ht 64.0 in | Wt 181.0 lb

## 2018-06-26 DIAGNOSIS — I10 Essential (primary) hypertension: Secondary | ICD-10-CM | POA: Diagnosis not present

## 2018-06-26 MED ORDER — HYDROCHLOROTHIAZIDE 25 MG PO TABS: 25.0000 mg | ORAL_TABLET | Freq: Every day | ORAL | 3 refills | Status: DC

## 2018-06-26 NOTE — Progress Notes (Signed)
Subjective:    Patient ID: Karen Huynh, female    DOB: [birth year], 67 y.o.   MRN: [id number]  HPI Blood pressure has been running high recently.  At home, her systolic blood pressure has been in the 170s.  She recently went to the hospital.  BMP there was normal.  Hemoglobin was normal.  She denies any chest pain.  She denies any shortness of breath.  She denies any headache although she does report a pressure-like sensation in her frontal area.  She denies any vision changes.  She denies any oliguria or hematuria.  She denies any peripheral edema.  She does eat salt.  She is not exercising. Past Medical History:  Diagnosis Date  . Bronchitis   . Constipation   . H/O: rheumatic fever    as child  . Heart murmur   . High cholesterol   . History of pneumonia   . Leukopenia   . Leukopenia 06/17/2013  . Muscle cramps 08/12/2013  . OA (osteoarthritis of spine)   . Thoracic spine pain 09/03/2014   Past Surgical History:  Procedure Laterality Date  . COLONOSCOPY N/A 09/11/2015   Procedure: COLONOSCOPY;  Surgeon: Danie Binder, MD;  Location: [facility];  Service: Endoscopy;  Laterality: N/A;  1030 - moved to 12:00 - pt knows to arrive at 11:00  . COLONOSCOPY W/ BIOPSIES AND POLYPECTOMY  SEP 2010    SIMPLE ADENOMA(1)   Current Outpatient Medications on File Prior to Visit  Medication Sig Dispense Refill  . Ascorbic Acid (VITAMIN C) 1000 MG tablet Take 1,000 mg by mouth daily.    . Cholecalciferol (VITAMIN D) 2000 UNITS CAPS Take 1 capsule by mouth daily.    . cyclobenzaprine (FLEXERIL) 10 MG tablet Take 1 tablet (10 mg total) by mouth 3 (three) times daily as needed for muscle spasms. 30 tablet 3  . HYDROcodone-acetaminophen (NORCO/VICODIN) 5-325 MG tablet Take 1 tablet by mouth every 6 (six) hours as needed for moderate pain. 60 tablet 0  . simvastatin (ZOCOR) 20 MG tablet Take 1 tablet (20 mg total) by mouth daily. 90 tablet 3  . albuterol (PROVENTIL HFA;VENTOLIN HFA) 108 (90 Base)  MCG/ACT inhaler Inhale 2 puffs into the lungs every 4 (four) hours as needed for wheezing or shortness of breath. (Patient not taking: Reported on 06/26/2018) 1 Inhaler 0   No current facility-administered medications on file prior to visit.    Allergies  Allergen Reactions  . Penicillins     Gives pt yeast infection. Has patient had a PCN reaction causing immediate rash, facial/tongue/throat swelling, SOB or lightheadedness with hypotension: No Has patient had a PCN reaction causing severe rash involving mucus membranes or skin necrosis: No Has patient had a PCN reaction that required hospitalization No Has patient had a PCN reaction occurring within the last 10 years: No If all of the above answers are "NO", then may proceed with Cephalosporin use.    Social History   Socioeconomic History  . Marital status: Married    Spouse name: Not on file  . Number of children: Not on file  . Years of education: Not on file  . Highest education level: Not on file  Occupational History  . Not on file  Social Needs  . Financial resource strain: Not on file  . Food insecurity:    Worry: Not on file    Inability: Not on file  . Transportation needs:    Medical: Not on file    Non-medical:  Not on file  Tobacco Use  . Smoking status: Current Every Day Smoker    Packs/day: 0.00    Years: 47.00    Pack years: 0.00    Types: Cigarettes  . Smokeless tobacco: Never Used  . Tobacco comment: smokes 2 cig daily  Substance and Sexual Activity  . Alcohol use: No  . Drug use: No  . Sexual activity: Yes    Birth control/protection: Post-menopausal  Lifestyle  . Physical activity:    Days per week: Not on file    Minutes per session: Not on file  . Stress: Not on file  Relationships  . Social connections:    Talks on phone: Not on file    Gets together: Not on file    Attends religious service: Not on file    Active member of club or organization: Not on file    Attends meetings of clubs  or organizations: Not on file    Relationship status: Not on file  . Intimate partner violence:    Fear of current or ex partner: Not on file    Emotionally abused: Not on file    Physically abused: Not on file    Forced sexual activity: Not on file  Other Topics Concern  . Not on file  Social History Narrative  . Not on file      Review of Systems  All other systems reviewed and are negative.      Objective:   Physical Exam  Constitutional: She is oriented to person, place, and time. She appears well-developed and well-nourished.  Eyes: Pupils are equal, round, and reactive to light. Conjunctivae and EOM are normal.  Cardiovascular: Normal rate, regular rhythm and normal heart sounds.  Pulmonary/Chest: Effort normal and breath sounds normal.  Musculoskeletal: She exhibits no edema.  Neurological: She is alert and oriented to person, place, and time. No cranial nerve deficit or sensory deficit. She exhibits normal muscle tone. Coordination normal.  Vitals reviewed.         Assessment & Plan:  Benign essential HTN - Plan: hydrochlorothiazide (HYDRODIURIL) 25 MG tablet  Start hydrochlorothiazide 25 mg a day.  Decrease salt in diet.  Increase aerobic exercise.  Recheck blood pressure and BMP in 3 weeks to monitor potassium

## 2018-07-11 ENCOUNTER — Other Ambulatory Visit: Payer: Self-pay | Admitting: Adult Health

## 2018-07-11 DIAGNOSIS — Z1231 Encounter for screening mammogram for malignant neoplasm of breast: Secondary | ICD-10-CM

## 2018-07-20 ENCOUNTER — Ambulatory Visit: Payer: BLUE CROSS/BLUE SHIELD | Admitting: Family Medicine

## 2018-08-13 ENCOUNTER — Ambulatory Visit (HOSPITAL_COMMUNITY)
Admission: RE | Admit: 2018-08-13 | Discharge: 2018-08-13 | Disposition: A | Discharge status: Home / Self Care | Payer: BLUE CROSS/BLUE SHIELD | Source: Ambulatory Visit | Attending: Adult Health | Admitting: Adult Health

## 2018-08-13 ENCOUNTER — Encounter (HOSPITAL_COMMUNITY): Payer: Self-pay

## 2018-08-13 DIAGNOSIS — Z1231 Encounter for screening mammogram for malignant neoplasm of breast: Secondary | ICD-10-CM | POA: Diagnosis not present

## 2018-08-14 ENCOUNTER — Other Ambulatory Visit: Payer: Self-pay

## 2018-08-14 ENCOUNTER — Encounter: Payer: Self-pay | Admitting: Family Medicine

## 2018-08-14 ENCOUNTER — Ambulatory Visit (INDEPENDENT_AMBULATORY_CARE_PROVIDER_SITE_OTHER): Payer: BLUE CROSS/BLUE SHIELD | Admitting: Family Medicine

## 2018-08-14 VITALS — BP 128/78 | HR 68 | Temp 98.7°F | Resp 12 | Ht 64.0 in | Wt 181.0 lb

## 2018-08-14 DIAGNOSIS — E782 Mixed hyperlipidemia: Secondary | ICD-10-CM | POA: Diagnosis not present

## 2018-08-14 DIAGNOSIS — Z0000 Encounter for general adult medical examination without abnormal findings: Secondary | ICD-10-CM | POA: Diagnosis not present

## 2018-08-14 DIAGNOSIS — Z1159 Encounter for screening for other viral diseases: Secondary | ICD-10-CM

## 2018-08-14 DIAGNOSIS — Z23 Encounter for immunization: Secondary | ICD-10-CM

## 2018-08-14 DIAGNOSIS — M5136 Other intervertebral disc degeneration, lumbar region: Secondary | ICD-10-CM

## 2018-08-14 DIAGNOSIS — Z720 Tobacco use: Secondary | ICD-10-CM

## 2018-08-14 DIAGNOSIS — I10 Essential (primary) hypertension: Secondary | ICD-10-CM

## 2018-08-14 DIAGNOSIS — R7303 Prediabetes: Secondary | ICD-10-CM

## 2018-08-14 DIAGNOSIS — M8589 Other specified disorders of bone density and structure, multiple sites: Secondary | ICD-10-CM

## 2018-08-14 MED ORDER — HYDROCODONE-ACETAMINOPHEN 5-325 MG PO TABS: 1.0000 | ORAL_TABLET | Freq: Four times a day (QID) | ORAL | 0 refills | Status: DC | PRN

## 2018-08-14 MED ORDER — CYCLOBENZAPRINE HCL 10 MG PO TABS: 10.0000 mg | ORAL_TABLET | Freq: Three times a day (TID) | ORAL | 3 refills | Status: DC | PRN

## 2018-08-14 MED ORDER — VARENICLINE TARTRATE 0.5 MG X 11 & 1 MG X 42 PO MISC: ORAL | 0 refills | Status: DC

## 2018-08-14 NOTE — Assessment & Plan Note (Signed)
cHECK a1c DISCUSSED DIETARY CHANGES, EXERCISE

## 2018-08-14 NOTE — Assessment & Plan Note (Signed)
Start Chantix

## 2018-08-14 NOTE — Progress Notes (Signed)
Subjective:   Patient presents for Medicare Annual/Subsequent preventive examination.    HTN- taking HCTZ without any difficulty     DDD lumbar disc-uses norco and flexeril as  as needed, request refill on pain medications    Hyperlipdemia- taking her zocor     Uses probiotics to keep her regular     Wants to quit smoking    Review Past Medical/Family/Social: Per EMR    Risk Factors  Current exercise habits:  Dietary issues discussed:   Cardiac risk factors: Obesity (BMI >= 30 kg/m2).   Depression Screen  (Note: if answer to either of the following is "Yes", a more complete depression screening is indicated)  Over the past two weeks, have you felt down, depressed or hopeless? No Over the past two weeks, have you felt little interest or pleasure in doing things? No Have you lost interest or pleasure in daily life? No Do you often feel hopeless? No Do you cry easily over simple problems? No   Activities of Daily Living  In your present state of health, do you have any difficulty performing the following activities?:  Driving? No  Managing money? No  Feeding yourself? No  Getting from bed to chair? No  Climbing a flight of stairs? No  Preparing food and eating?: No  Bathing or showering? No  Getting dressed: No  Getting to the toilet? No  Using the toilet:No  Moving around from place to place: No  In the past year have you fallen or had a near fall?:No  Are you sexually active? No  Do you have more than one partner? No   Hearing Difficulties: No  Do you often ask people to speak up or repeat themselves? No  Do you experience ringing or noises in your ears? No Do you have difficulty understanding soft or whispered voices? No  Do you feel that you have a problem with memory? No Do you often misplace items? No  Do you feel safe at home? Yes  Cognitive Testing  Alert? Yes Normal Appearance?Yes  Oriented to person? Yes Place? Yes  Time? Yes  Recall of three objects?  Yes  Can perform simple calculations? Yes  Displays appropriate judgment?Yes  Can read the correct time from a watch face?Yes   List the Names of Other Physician/Practitioners you currently use:    Screening Tests / Date Colonoscopy       UTD              Zostavax  Mammogram  UTD  Influenza Vaccine  UTD  Tetanus/tdap Due Pneumonia- Hep C screening -  Bone Density        ROS: GEN- denies fatigue, fever, weight loss,weakness, recent illness HEENT- denies eye drainage, change in vision, nasal discharge, CVS- denies chest pain, palpitations RESP- denies SOB, cough, wheeze ABD- denies N/V, change in stools, abd pain GU- denies dysuria, hematuria, dribbling, incontinence MSK- denies joint pain, muscle aches, injury Neuro- denies headache, dizziness, syncope, seizure activity  Physical: Vitals reviewed  GEN- NAD, alert and oriented x3 HEENT- PERRL, EOMI, non injected sclera, pink conjunctiva, MMM, oropharynx clear Neck- Supple, no thryomegaly CVS- RRR, no murmur RESP-CTAB ABD-NABS,soft,NT,ND EXT- No edema Pulses- Radial, DP- 2+    Assessment:    Annual wellness medicare exam   Plan:    During the course of the visit the patient was educated and counseled about appropriate screening and preventive services including:   Bone Density- due for repeat screening, osteopenia in 2013, has been on  vitamin D and calcium   Discussed advanced directives, handout given   Hep C screening    Fall/Depression/ Audit C screening NEGATIVE  Nothing needed for urinary symptoms at this time      Urinary symptoms- has some urgency and little leaking on occasional, wears a panty liners            Diet review for nutrition referral? Yes ____ Not Indicated __x__  Patient Instructions (the written plan) was given to the patient.  Medicare Attestation  I have personally reviewed:  The patient's medical and social history  Their use of alcohol, tobacco or illicit drugs   Their current medications and supplements  The patient's functional ability including ADLs,fall risks, home safety risks, cognitive, and hearing and visual impairment  Diet and physical activities  Evidence for depression or mood disorders  The patient's weight, height, BMI, and visual acuity have been recorded in the chart. I have made referrals, counseling, and provided education to the patient based on review of the above and I have provided the patient with a written personalized care plan for preventive services.

## 2018-08-14 NOTE — Assessment & Plan Note (Signed)
Continue statin drug, goal LDL < 100  Check lipids

## 2018-08-14 NOTE — Assessment & Plan Note (Signed)
Pain medication refilled

## 2018-08-14 NOTE — Assessment & Plan Note (Signed)
Repeat Bone Density, continue calcium and vitamin D

## 2018-08-14 NOTE — Assessment & Plan Note (Signed)
Well controlled on HCTZ

## 2018-08-14 NOTE — Patient Instructions (Signed)
Schedule Bone Density  Start Chantix  Pneumonia Booster given  We will call with lab results  F/U 4 months

## 2018-08-15 LAB — COMPREHENSIVE METABOLIC PANEL
AG Ratio: 1.5 (calc) (ref 1.0–2.5)
ALBUMIN MSPROF: 4 g/dL (ref 3.6–5.1)
ALKALINE PHOSPHATASE (APISO): 59 U/L (ref 33–130)
ALT: 16 U/L (ref 6–29)
AST: 23 U/L (ref 10–35)
BILIRUBIN TOTAL: 0.3 mg/dL (ref 0.2–1.2)
BUN: 15 mg/dL (ref 7–25)
CALCIUM: 10.1 mg/dL (ref 8.6–10.4)
CHLORIDE: 105 mmol/L (ref 98–110)
CO2: 25 mmol/L (ref 20–32)
CREATININE: 0.75 mg/dL (ref 0.50–0.99)
GLOBULIN: 2.6 g/dL (ref 1.9–3.7)
Glucose, Bld: 102 mg/dL — ABNORMAL HIGH (ref 65–99)
POTASSIUM: 4.6 mmol/L (ref 3.5–5.3)
SODIUM: 143 mmol/L (ref 135–146)
TOTAL PROTEIN: 6.6 g/dL (ref 6.1–8.1)

## 2018-08-15 LAB — CBC WITH DIFFERENTIAL/PLATELET
ABSOLUTE MONOCYTES: 431 {cells}/uL (ref 200–950)
BASOS PCT: 1.3 %
Basophils Absolute: 40 cells/uL (ref 0–200)
Eosinophils Absolute: 99 cells/uL (ref 15–500)
Eosinophils Relative: 3.2 %
HEMATOCRIT: 45.1 % — AB (ref 35.0–45.0)
Hemoglobin: 14.7 g/dL (ref 11.7–15.5)
LYMPHS ABS: 1857 {cells}/uL (ref 850–3900)
MCH: 28.1 pg (ref 27.0–33.0)
MCHC: 32.6 g/dL (ref 32.0–36.0)
MCV: 86.1 fL (ref 80.0–100.0)
MPV: 10.8 fL (ref 7.5–12.5)
Monocytes Relative: 13.9 %
NEUTROS ABS: 673 {cells}/uL — AB (ref 1500–7800)
Neutrophils Relative %: 21.7 %
Platelets: 236 10*3/uL (ref 140–400)
RBC: 5.24 10*6/uL — AB (ref 3.80–5.10)
RDW: 12.7 % (ref 11.0–15.0)
Total Lymphocyte: 59.9 %
WBC: 3.1 10*3/uL — AB (ref 3.8–10.8)

## 2018-08-15 LAB — HEMOGLOBIN A1C
HEMOGLOBIN A1C: 5.9 %{Hb} — AB (ref <5.7)
Mean Plasma Glucose: 123 (calc)
eAG (mmol/L): 6.8 (calc)

## 2018-08-15 LAB — LIPID PANEL
CHOL/HDL RATIO: 4 (calc) (ref <5.0)
Cholesterol: 188 mg/dL (ref <200)
HDL: 47 mg/dL — AB (ref >50)
LDL Cholesterol (Calc): 127 mg/dL (calc) — ABNORMAL HIGH
NON-HDL CHOLESTEROL (CALC): 141 mg/dL — AB (ref <130)
Triglycerides: 58 mg/dL (ref <150)

## 2018-08-15 LAB — HEPATITIS C ANTIBODY
Hepatitis C Ab: NONREACTIVE
SIGNAL TO CUT-OFF: 0.01 (ref <1.00)

## 2018-08-20 ENCOUNTER — Other Ambulatory Visit: Payer: Self-pay | Admitting: *Deleted

## 2018-08-20 MED ORDER — SIMVASTATIN 40 MG PO TABS: 40.0000 mg | ORAL_TABLET | Freq: Every day | ORAL | 3 refills | Status: DC

## 2018-09-05 ENCOUNTER — Encounter: Payer: Self-pay | Admitting: Gastroenterology

## 2018-09-17 ENCOUNTER — Ambulatory Visit (HOSPITAL_COMMUNITY): Payer: BLUE CROSS/BLUE SHIELD

## 2018-09-17 ENCOUNTER — Ambulatory Visit (HOSPITAL_COMMUNITY)
Admission: RE | Admit: 2018-09-17 | Discharge: 2018-09-17 | Disposition: A | Discharge status: Home / Self Care | Payer: BLUE CROSS/BLUE SHIELD | Source: Ambulatory Visit | Attending: Family Medicine | Admitting: Family Medicine

## 2018-09-17 ENCOUNTER — Encounter (HOSPITAL_COMMUNITY): Payer: Self-pay

## 2018-09-17 DIAGNOSIS — M8589 Other specified disorders of bone density and structure, multiple sites: Secondary | ICD-10-CM

## 2018-09-20 ENCOUNTER — Encounter: Payer: Self-pay | Admitting: *Deleted

## 2018-12-05 ENCOUNTER — Ambulatory Visit (INDEPENDENT_AMBULATORY_CARE_PROVIDER_SITE_OTHER): Payer: BLUE CROSS/BLUE SHIELD | Admitting: Adult Health

## 2018-12-05 ENCOUNTER — Encounter: Payer: Self-pay | Admitting: Adult Health

## 2018-12-05 ENCOUNTER — Other Ambulatory Visit (HOSPITAL_COMMUNITY)
Admission: RE | Admit: 2018-12-05 | Discharge: 2018-12-05 | Disposition: A | Discharge status: Home / Self Care | Payer: Medicare Other | Source: Ambulatory Visit | Attending: Adult Health | Admitting: Adult Health

## 2018-12-05 ENCOUNTER — Other Ambulatory Visit: Payer: Self-pay

## 2018-12-05 VITALS — BP 116/70 | HR 67 | Ht 64.0 in | Wt 192.0 lb

## 2018-12-05 DIAGNOSIS — Z1151 Encounter for screening for human papillomavirus (HPV): Secondary | ICD-10-CM | POA: Insufficient documentation

## 2018-12-05 DIAGNOSIS — Z1211 Encounter for screening for malignant neoplasm of colon: Secondary | ICD-10-CM | POA: Insufficient documentation

## 2018-12-05 DIAGNOSIS — Z124 Encounter for screening for malignant neoplasm of cervix: Secondary | ICD-10-CM | POA: Diagnosis not present

## 2018-12-05 DIAGNOSIS — Z1212 Encounter for screening for malignant neoplasm of rectum: Secondary | ICD-10-CM | POA: Diagnosis not present

## 2018-12-05 DIAGNOSIS — Z01419 Encounter for gynecological examination (general) (routine) without abnormal findings: Secondary | ICD-10-CM | POA: Diagnosis present

## 2018-12-05 LAB — HEMOCCULT GUIAC POC 1CARD (OFFICE): Fecal Occult Blood, POC: NEGATIVE

## 2018-12-05 NOTE — Progress Notes (Signed)
Patient ID: Karen Huynh, female   DOB: 1951/05/24, 68 y.o.   MRN: [id number] History of Present Illness: Karen Huynh is a 68 year old black female, married, PM and active, in for pap and pelvic exam, she had wellness exam and labs with Dr Buelah Manis 08/14/18. PCP is Dr Buelah Manis.    Current Medications, Allergies, Past Medical History, Past Surgical History, Family History and Social History were reviewed in [facility] record.     Review of Systems: Patient denies any headaches,cough or fever, hearing loss, fatigue, blurred vision, shortness of breath, chest pain, abdominal pain, problems with bowel movements, urination, or intercourse.(not active often) No joint pain or mood swings.    Physical Exam:BP 116/70 (BP Location: Left Arm, Patient Position: Sitting, Cuff Size: Normal)   Pulse 67   Ht 5\' 4"  (1.626 m)   Wt 192 lb (87.1 kg)   BMI 32.96 kg/m  General:  Well developed, well nourished, no acute distress Skin:  Warm and dry Neck:  Midline trachea, normal thyroid, good ROM, no lymphadenopathy,no carotid bruits heard. Lungs; Clear to auscultation bilaterally Cardiovascular: Regular rate and rhythm Pelvic:  External genitalia is normal in appearance, no lesions.  The vagina is normal in appearance for age with loss of color, moisture and rugae. Urethra has no lesions or masses. The cervix is smooth, pap with HPV performed.  Uterus is felt to be normal size, shape, and contour.  No adnexal masses or tenderness noted.Bladder is non tender, no masses felt. Rectal: Good sphincter tone, no polyps, or hemorrhoids felt.  Hemoccult negative. Psych:  No mood changes, alert and cooperative,seems happy Fall risk is low. PHQ 2 score  Examination chaperoned by Estill Bamberg Rash LPN.  Impression: 1. Encounter for gynecological examination with Papanicolaou smear of cervix - Cytology - PAP( [facility])    Return in 3 years for pap  2. Screening for colorectal cancer - POCT occult blood  stool    Plan: Physical and labs with Dr Buelah Manis Pap in 3 years if this one normal Mammogram every 1-2 years DEXA done 09/17/18  Colonoscopy per GI

## 2018-12-11 LAB — CYTOLOGY - PAP
Adequacy: ABSENT
Diagnosis: NEGATIVE
HPV: NOT DETECTED

## 2018-12-12 ENCOUNTER — Ambulatory Visit: Payer: BLUE CROSS/BLUE SHIELD | Admitting: Adult Health

## 2019-03-29 ENCOUNTER — Other Ambulatory Visit: Payer: Self-pay

## 2019-03-29 ENCOUNTER — Other Ambulatory Visit: Payer: BLUE CROSS/BLUE SHIELD

## 2019-03-29 DIAGNOSIS — Z20822 Contact with and (suspected) exposure to covid-19: Secondary | ICD-10-CM

## 2019-03-30 LAB — NOVEL CORONAVIRUS, NAA: SARS-CoV-2, NAA: NOT DETECTED

## 2019-04-01 ENCOUNTER — Telehealth: Payer: Self-pay | Admitting: Hematology

## 2019-04-01 NOTE — Telephone Encounter (Signed)
Pt is aware covid 19 test is negative °

## 2019-04-24 ENCOUNTER — Ambulatory Visit (INDEPENDENT_AMBULATORY_CARE_PROVIDER_SITE_OTHER): Payer: Self-pay | Admitting: *Deleted

## 2019-04-24 ENCOUNTER — Other Ambulatory Visit: Payer: Self-pay

## 2019-04-24 DIAGNOSIS — Z86010 Personal history of colonic polyps: Secondary | ICD-10-CM

## 2019-04-24 MED ORDER — NA SULFATE-K SULFATE-MG SULF 17.5-3.13-1.6 GM/177ML PO SOLN: 1.0000 | Freq: Once | ORAL | 0 refills | Status: AC

## 2019-04-24 NOTE — Patient Instructions (Addendum)
Karen Huynh   15-Aug-1951 MRN: [id number]    Procedure Date:  06/14/2019 Time to register: 10:15 am Place to register: [facility] Procedure Time: 11:15 am Scheduled provider: Dr. Oneida Alar  PREPARATION FOR COLONOSCOPY WITH TRI-LYTE SPLIT PREP  Please notify us immediately if you are diabetic, take iron supplements, or if you are on Coumadin or any other blood thinners.    You will need to purchase 1 fleet enema and 1 box of Bisacodyl 77m tablets.   1 DAY BEFORE PROCEDURE:  DATE: 06/13/2019   DAY: Thursday Continue clear liquids the entire day - NO SOLID FOOD.    At 2:00 pm:  Take 2 Bisacodyl tablets.   At 4:00pm:  Start drinking your solution. Make sure you mix well per instructions on the bottle. Try to drink 1 (one) 8 ounce glass every 10-15 minutes until you have consumed HALF the jug. You should complete by 6:00pm.You must keep the left over solution refrigerated until completed next day.  Continue clear liquids. You must drink plenty of clear liquids to prevent dehyration and kidney failure.     DAY OF PROCEDURE:   DATE: 06/14/2019  DAY: Friday If you take medications for your heart, blood pressure or breathing, you may take these medications.   Five hours before your procedure time @ 6:15 am:  Finish remaining amout of bowel prep, drinking 1 (one) 8 ounce glass every 10-15 minutes until complete. You have two hours to consume remaining prep.   Three hours before your procedure time @ 8:15 am:  Nothing by mouth.   At least one hour before going to the hospital:  Give yourself one Fleet enema. You may take your morning medications with sip of water unless we have instructed otherwise.      Please see below for Dietary Information.  CLEAR LIQUIDS INCLUDE:  Water Jello (NOT red in color)   Ice Popsicles (NOT red in color)   Tea (sugar ok, no milk/cream) Powdered fruit flavored drinks  Coffee (sugar ok, no milk/cream) Gatorade/ Lemonade/ Kool-Aid  (NOT  red in color)   Juice: apple, white grape, white cranberry Soft drinks  Clear bullion, consomme, broth (fat free beef/chicken/vegetable)  Carbonated beverages (any kind)  Strained chicken noodle soup Hard Candy   Remember: Clear liquids are liquids that will allow you to see your fingers on the other side of a clear glass. Be sure liquids are NOT red in color, and not cloudy, but CLEAR.  DO NOT EAT OR DRINK ANY OF THE FOLLOWING:  Dairy products of any kind   Cranberry juice Tomato juice / V8 juice   Grapefruit juice Orange juice     Red grape juice  Do not eat any solid foods, including such foods as: cereal, oatmeal, yogurt, fruits, vegetables, creamed soups, eggs, bread, crackers, pureed foods in a blender, etc.   HELPFUL HINTS FOR DRINKING PREP SOLUTION:   Make sure prep is extremely cold. Mix and refrigerate the the morning of the prep. You may also put in the freezer.   You may try mixing some Crystal Light or Country Time Lemonade if you prefer. Mix in small amounts; add more if necessary.  Try drinking through a straw  Rinse mouth with water or a mouthwash between glasses, to remove after-taste.  Try sipping on a cold beverage /ice/ popsicles between glasses of prep.  Place a piece of sugar-free hard candy in mouth between glasses.  If you become nauseated, try consuming smaller amounts,  or stretch out the time between glasses. Stop for 30-60 minutes, then slowly start back drinking.        OTHER INSTRUCTIONS  You will need a responsible adult at least 68 years of age to accompany you and drive you home. This person must remain in the waiting room during your procedure. The hospital will cancel your procedure if you do not have a responsible adult with you.   1. Wear loose fitting clothing that is easily removed. 2. Leave jewelry and other valuables at home.  3. Remove all body piercing jewelry and leave at home. 4. Total time from sign-in until discharge is  approximately 2-3 hours. 5. You should go home directly after your procedure and rest. You can resume normal activities the day after your procedure. 6. The day of your procedure you should not:  Drive  Make legal decisions  Operate machinery  Drink alcohol  Return to work   You may call the office (Dept: [phone]) before 5:00pm, or page the doctor on call [phone]) after 5:00pm, for further instructions, if necessary.   Insurance Information YOU WILL NEED TO CHECK WITH YOUR INSURANCE COMPANY FOR THE BENEFITS OF COVERAGE YOU HAVE FOR THIS PROCEDURE.  UNFORTUNATELY, NOT ALL INSURANCE COMPANIES HAVE BENEFITS TO COVER ALL OR PART OF THESE TYPES OF PROCEDURES.  IT IS YOUR RESPONSIBILITY TO CHECK YOUR BENEFITS, HOWEVER, WE WILL BE GLAD TO ASSIST YOU WITH ANY CODES YOUR INSURANCE COMPANY MAY NEED.    PLEASE NOTE THAT MOST INSURANCE COMPANIES WILL NOT COVER A SCREENING COLONOSCOPY FOR PEOPLE UNDER THE AGE OF 50  IF YOU HAVE BCBS INSURANCE, YOU MAY HAVE BENEFITS FOR A SCREENING COLONOSCOPY BUT IF POLYPS ARE FOUND THE DIAGNOSIS WILL CHANGE AND THEN YOU MAY HAVE A DEDUCTIBLE THAT WILL NEED TO BE MET. SO PLEASE MAKE SURE YOU CHECK YOUR BENEFITS FOR A SCREENING COLONOSCOPY AS WELL AS A DIAGNOSTIC COLONOSCOPY.

## 2019-04-24 NOTE — Progress Notes (Addendum)
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 Diabetes Melitis: No 2. Joint replacements in the past 12 months: No 3. Major health problems in the past 3 months: No 4. Has an artificial valve or MVP: No 5. Has a defibrillator: No 6. Has been advised in past to take antibiotics in advance of a procedure like teeth cleaning: No 7. Family history of colon cancer: Yes, mother age 68 8. Alcohol Use: No 9. History of sleep apnea: No 10. History of coronary artery or other vascular stents placed within the last 12 months: No 11. History of any prior anesthesia complications: No    MEDICATIONS & ALLERGIES:    Patient reports the following regarding taking any blood thinners:   Plavix? No Aspirin? No Coumadin? No Brilinta? No Xarelto? No Eliquis? No Pradaxa? No Savaysa? No Effient? No  Patient confirms/reports the following medications:  Current Outpatient Medications  Medication Sig Dispense Refill  . Ascorbic Acid (VITAMIN C) 1000 MG tablet Take 1,000 mg by mouth daily.    . Cholecalciferol (VITAMIN D) 2000 UNITS CAPS Take 1 capsule by mouth daily.    . cyclobenzaprine (FLEXERIL) 10 MG tablet Take 1 tablet (10 mg total) by mouth 3 (three) times daily as needed for muscle spasms. (Patient taking differently: Take 10 mg by mouth as needed for muscle spasms. ) 30 tablet 3  . hydrochlorothiazide (HYDRODIURIL) 25 MG tablet Take 1 tablet (25 mg total) by mouth daily. 90 tablet 3  . HYDROcodone-acetaminophen (NORCO/VICODIN) 5-325 MG tablet Take 1 tablet by mouth every 6 (six) hours as needed for moderate pain. (Patient taking differently: Take 1 tablet by mouth as needed for moderate pain. ) 60 tablet 0  . saccharomyces boulardii (FLORASTOR) 250 MG capsule Take 250 mg by mouth  daily.     . simvastatin (ZOCOR) 40 MG tablet Take 1 tablet (40 mg total) by mouth daily. 90 tablet 3   No current facility-administered medications for this visit.     Patient confirms/reports the following allergies:  Allergies  Allergen Reactions  . Penicillins     Gives pt yeast infection. Has patient had a PCN reaction causing immediate rash, facial/tongue/throat swelling, SOB or lightheadedness with hypotension: No Has patient had a PCN reaction causing severe rash involving mucus membranes or skin necrosis: No Has patient had a PCN reaction that required hospitalization No Has patient had a PCN reaction occurring within the last 10 years: No If all of the above answers are "NO", then may proceed with Cephalosporin use.     No orders of the defined types were placed in this encounter.   AUTHORIZATION INFORMATION Primary Insurance: [place],  [place] [id number] ,  Group #: [id number] Pre-Cert / Auth required: No, file to [employer]  Secondary Insurance: Medicare Part A ,  ID #: [id number] Pre-Cert / Auth required: No, not required  SCHEDULE INFORMATION: Procedure has been scheduled as follows:  Date: 06/14/2019, Time: 11:15 Location: [facility] with Dr. Oneida Alar  This Gastroenterology Pre-Precedure Review Form is being routed to the following provider(s): Neil Crouch, PA-C

## 2019-04-26 NOTE — Progress Notes (Signed)
Rarely uses hydrocodone per controlled substance database, no RX in 2020. OK to schedule.

## 2019-04-29 ENCOUNTER — Telehealth: Payer: Self-pay | Admitting: *Deleted

## 2019-04-29 MED ORDER — PEG 3350-KCL-NA BICARB-NACL 420 G PO SOLR: 4000.0000 mL | Freq: Once | ORAL | 0 refills | Status: AC

## 2019-04-29 NOTE — Addendum Note (Signed)
Addended by: Metro Kung on: 04/29/2019 04:00 PM   Modules accepted: Orders

## 2019-04-29 NOTE — Telephone Encounter (Signed)
[place], PREP THAT WAS SENT IN FOR PROCEDURE IS TOO EXPENSIVE

## 2019-04-29 NOTE — Telephone Encounter (Signed)
Spoke with pt and she is aware that I will send new RX for Golytely to [facility].  Pt also made aware that I will send new prep instructions to her.  Pt voiced understanding.

## 2019-04-29 NOTE — Addendum Note (Signed)
Addended by: Metro Kung on: 04/29/2019 07:47 AM   Modules accepted: Orders, SmartSet

## 2019-05-01 NOTE — Telephone Encounter (Signed)
Noted  

## 2019-05-22 ENCOUNTER — Other Ambulatory Visit: Payer: Self-pay

## 2019-05-22 ENCOUNTER — Encounter: Payer: Self-pay | Admitting: Family Medicine

## 2019-05-22 ENCOUNTER — Ambulatory Visit (INDEPENDENT_AMBULATORY_CARE_PROVIDER_SITE_OTHER): Payer: BC Managed Care – PPO | Admitting: Family Medicine

## 2019-05-22 VITALS — BP 132/68 | HR 84 | Temp 98.7°F | Resp 16 | Ht 64.0 in | Wt 194.0 lb

## 2019-05-22 DIAGNOSIS — Z6833 Body mass index (BMI) 33.0-33.9, adult: Secondary | ICD-10-CM

## 2019-05-22 DIAGNOSIS — I10 Essential (primary) hypertension: Secondary | ICD-10-CM | POA: Diagnosis not present

## 2019-05-22 DIAGNOSIS — R7303 Prediabetes: Secondary | ICD-10-CM

## 2019-05-22 DIAGNOSIS — Z23 Encounter for immunization: Secondary | ICD-10-CM

## 2019-05-22 DIAGNOSIS — E782 Mixed hyperlipidemia: Secondary | ICD-10-CM | POA: Diagnosis not present

## 2019-05-22 DIAGNOSIS — E6609 Other obesity due to excess calories: Secondary | ICD-10-CM

## 2019-05-22 DIAGNOSIS — M5136 Other intervertebral disc degeneration, lumbar region: Secondary | ICD-10-CM | POA: Diagnosis not present

## 2019-05-22 MED ORDER — HYDROCHLOROTHIAZIDE 25 MG PO TABS: 25.0000 mg | ORAL_TABLET | Freq: Every day | ORAL | 3 refills | Status: DC

## 2019-05-22 MED ORDER — SIMVASTATIN 40 MG PO TABS: 40.0000 mg | ORAL_TABLET | Freq: Every day | ORAL | 3 refills | Status: DC

## 2019-05-22 MED ORDER — HYDROCODONE-ACETAMINOPHEN 5-325 MG PO TABS: 1.0000 | ORAL_TABLET | Freq: Four times a day (QID) | ORAL | 0 refills | Status: DC | PRN

## 2019-05-22 MED ORDER — CYCLOBENZAPRINE HCL 10 MG PO TABS: 10.0000 mg | ORAL_TABLET | Freq: Three times a day (TID) | ORAL | 3 refills | Status: DC | PRN

## 2019-05-22 NOTE — Assessment & Plan Note (Signed)
Blood pressure  Controlled no change in medication.

## 2019-05-22 NOTE — Assessment & Plan Note (Signed)
Check nonfasting direct LDL continue simvastatin we will also check her liver function tests and her A1c secondary history of borderline diabetes mellitus.  We did discuss dietary changes as she has increased some weight.

## 2019-05-22 NOTE — Patient Instructions (Signed)
F/U 6 months for Physical  We will call with lab results Flu shot given

## 2019-05-22 NOTE — Assessment & Plan Note (Signed)
I have refilled her pain medication she uses very sparingly as well as her muscle relaxer.

## 2019-05-22 NOTE — Progress Notes (Signed)
   Subjective:    Patient ID: Karen Huynh, female    DOB: 11/15/1950, 68 y.o.   MRN: [id number]  Patient presents for Follow-up (is not fasting)   Pt here to f/u chronic medical problems    HTN- taking HCTZ without any difficulty     DDD lumbar disc-uses norco and flexeril as  as needed, request refill on pain medications , last given script in December    Hyperlipdemia- taking her zocor    She has been smoke free for 9 months     Due for flu shot   Review Of Systems:  GEN- denies fatigue, fever, weight loss,weakness, recent illness HEENT- denies eye drainage, change in vision, nasal discharge, CVS- denies chest pain, palpitations RESP- denies SOB, cough, wheeze ABD- denies N/V, change in stools, abd pain GU- denies dysuria, hematuria, dribbling, incontinence MSK- denies joint pain, muscle aches, injury Neuro- denies headache, dizziness, syncope, seizure activity       Objective:    BP 132/68   Pulse 84   Temp 98.7 F (37.1 C) (Oral)   Resp 16   Ht 5\' 4"  (1.626 m)   Wt 194 lb (88 kg)   SpO2 97%   BMI 33.30 kg/m  GEN- NAD, alert and oriented x3 HEENT- PERRL, EOMI, non injected sclera, pink conjunctiva, MMM, oropharynx clear Neck- Supple, no thyromegaly CVS- RRR, no murmur RESP-CTAB ABD-NABS,soft,NT,ND EXT- No edema Pulses- Radial, DP- 2+        Assessment & Plan:      Problem List Items Addressed This Visit      Unprioritized   Borderline diabetes mellitus   Relevant Orders   Hemoglobin A1c   DDD (degenerative disc disease), lumbar - Primary    I have refilled her pain medication she uses very sparingly as well as her muscle relaxer.      Relevant Medications   cyclobenzaprine (FLEXERIL) 10 MG tablet   HYDROcodone-acetaminophen (NORCO/VICODIN) 5-325 MG tablet   Hyperlipidemia    Check nonfasting direct LDL continue simvastatin we will also check her liver function tests and her A1c secondary history of borderline diabetes mellitus.  We did  discuss dietary changes as she has increased some weight.      Relevant Medications   simvastatin (ZOCOR) 40 MG tablet   hydrochlorothiazide (HYDRODIURIL) 25 MG tablet   Other Relevant Orders   LDL Cholesterol, Direct   Hypertension    Blood pressure  Controlled no change in medication.      Relevant Medications   simvastatin (ZOCOR) 40 MG tablet   hydrochlorothiazide (HYDRODIURIL) 25 MG tablet   Other Relevant Orders   CBC with Differential/Platelet   Comprehensive metabolic panel   Obesity    Other Visit Diagnoses    Benign essential HTN       Relevant Medications   simvastatin (ZOCOR) 40 MG tablet   hydrochlorothiazide (HYDRODIURIL) 25 MG tablet   Need for immunization against influenza       Relevant Orders   Flu Vaccine QUAD High Dose(Fluad) (Completed)      Note: This dictation was prepared with Dragon dictation along with smaller phrase technology. Any transcriptional errors that result from this process are unintentional.

## 2019-05-23 LAB — COMPREHENSIVE METABOLIC PANEL
AG Ratio: 1.6 (calc) (ref 1.0–2.5)
ALT: 25 U/L (ref 6–29)
AST: 32 U/L (ref 10–35)
Albumin: 4.4 g/dL (ref 3.6–5.1)
Alkaline phosphatase (APISO): 50 U/L (ref 37–153)
BUN: 15 mg/dL (ref 7–25)
CO2: 27 mmol/L (ref 20–32)
Calcium: 10.3 mg/dL (ref 8.6–10.4)
Chloride: 103 mmol/L (ref 98–110)
Creat: 0.78 mg/dL (ref 0.50–0.99)
Globulin: 2.7 g/dL (calc) (ref 1.9–3.7)
Glucose, Bld: 91 mg/dL (ref 65–99)
Potassium: 4 mmol/L (ref 3.5–5.3)
Sodium: 139 mmol/L (ref 135–146)
Total Bilirubin: 0.4 mg/dL (ref 0.2–1.2)
Total Protein: 7.1 g/dL (ref 6.1–8.1)

## 2019-05-23 LAB — CBC WITH DIFFERENTIAL/PLATELET
Absolute Monocytes: 568 cells/uL (ref 200–950)
Basophils Absolute: 40 cells/uL (ref 0–200)
Basophils Relative: 0.9 %
Eosinophils Absolute: 101 cells/uL (ref 15–500)
Eosinophils Relative: 2.3 %
HCT: 41 % (ref 35.0–45.0)
Hemoglobin: 13.8 g/dL (ref 11.7–15.5)
Lymphs Abs: 2548 cells/uL (ref 850–3900)
MCH: 28.5 pg (ref 27.0–33.0)
MCHC: 33.7 g/dL (ref 32.0–36.0)
MCV: 84.5 fL (ref 80.0–100.0)
MPV: 11.2 fL (ref 7.5–12.5)
Monocytes Relative: 12.9 %
Neutro Abs: 1144 cells/uL — ABNORMAL LOW (ref 1500–7800)
Neutrophils Relative %: 26 %
Platelets: 210 10*3/uL (ref 140–400)
RBC: 4.85 10*6/uL (ref 3.80–5.10)
RDW: 12.7 % (ref 11.0–15.0)
Total Lymphocyte: 57.9 %
WBC: 4.4 10*3/uL (ref 3.8–10.8)

## 2019-05-23 LAB — LDL CHOLESTEROL, DIRECT: Direct LDL: 115 mg/dL — ABNORMAL HIGH (ref <100)

## 2019-05-23 LAB — HEMOGLOBIN A1C
Hgb A1c MFr Bld: 6.1 % of total Hgb — ABNORMAL HIGH (ref <5.7)
Mean Plasma Glucose: 128 (calc)
eAG (mmol/L): 7.1 (calc)

## 2019-06-12 ENCOUNTER — Other Ambulatory Visit: Payer: Self-pay

## 2019-06-12 ENCOUNTER — Other Ambulatory Visit (HOSPITAL_COMMUNITY)
Admission: RE | Admit: 2019-06-12 | Discharge: 2019-06-12 | Disposition: A | Discharge status: Home / Self Care | Payer: BC Managed Care – PPO | Source: Ambulatory Visit | Attending: Gastroenterology | Admitting: Gastroenterology

## 2019-06-12 DIAGNOSIS — Z20828 Contact with and (suspected) exposure to other viral communicable diseases: Secondary | ICD-10-CM | POA: Diagnosis not present

## 2019-06-12 DIAGNOSIS — Z01812 Encounter for preprocedural laboratory examination: Secondary | ICD-10-CM | POA: Diagnosis present

## 2019-06-12 LAB — SARS CORONAVIRUS 2 (TAT 6-24 HRS): SARS Coronavirus 2: NEGATIVE

## 2019-06-14 ENCOUNTER — Ambulatory Visit (HOSPITAL_COMMUNITY)
Admission: RE | Admit: 2019-06-14 | Discharge: 2019-06-14 | Disposition: A | Discharge status: Home / Self Care | Payer: BC Managed Care – PPO | Attending: Gastroenterology | Admitting: Gastroenterology

## 2019-06-14 ENCOUNTER — Encounter (HOSPITAL_COMMUNITY): Payer: Self-pay | Admitting: *Deleted

## 2019-06-14 ENCOUNTER — Other Ambulatory Visit: Payer: Self-pay

## 2019-06-14 ENCOUNTER — Encounter (HOSPITAL_COMMUNITY)
Admission: RE | Disposition: A | Discharge status: Home / Self Care | Payer: Self-pay | Source: Home / Self Care | Attending: Gastroenterology

## 2019-06-14 DIAGNOSIS — D125 Benign neoplasm of sigmoid colon: Secondary | ICD-10-CM | POA: Diagnosis not present

## 2019-06-14 DIAGNOSIS — K648 Other hemorrhoids: Secondary | ICD-10-CM | POA: Insufficient documentation

## 2019-06-14 DIAGNOSIS — E7800 Pure hypercholesterolemia, unspecified: Secondary | ICD-10-CM | POA: Diagnosis not present

## 2019-06-14 DIAGNOSIS — D124 Benign neoplasm of descending colon: Secondary | ICD-10-CM | POA: Diagnosis not present

## 2019-06-14 DIAGNOSIS — Z09 Encounter for follow-up examination after completed treatment for conditions other than malignant neoplasm: Secondary | ICD-10-CM | POA: Diagnosis present

## 2019-06-14 DIAGNOSIS — Z860100 Personal history of colon polyps, unspecified: Secondary | ICD-10-CM

## 2019-06-14 DIAGNOSIS — Z87891 Personal history of nicotine dependence: Secondary | ICD-10-CM | POA: Insufficient documentation

## 2019-06-14 DIAGNOSIS — Q439 Congenital malformation of intestine, unspecified: Secondary | ICD-10-CM | POA: Diagnosis not present

## 2019-06-14 DIAGNOSIS — K644 Residual hemorrhoidal skin tags: Secondary | ICD-10-CM | POA: Insufficient documentation

## 2019-06-14 DIAGNOSIS — K5730 Diverticulosis of large intestine without perforation or abscess without bleeding: Secondary | ICD-10-CM | POA: Diagnosis not present

## 2019-06-14 DIAGNOSIS — K635 Polyp of colon: Secondary | ICD-10-CM | POA: Diagnosis not present

## 2019-06-14 HISTORY — PX: COLONOSCOPY: SHX5424

## 2019-06-14 HISTORY — PX: POLYPECTOMY: SHX5525

## 2019-06-14 SURGERY — COLONOSCOPY
Anesthesia: Moderate Sedation

## 2019-06-14 MED ORDER — MEPERIDINE HCL 100 MG/ML IJ SOLN
INTRAMUSCULAR | Status: AC
  Filled 2019-06-14: qty 2

## 2019-06-14 MED ORDER — ONDANSETRON HCL 4 MG/2ML IJ SOLN
INTRAMUSCULAR | Status: AC
  Filled 2019-06-14: qty 2

## 2019-06-14 MED ORDER — MEPERIDINE HCL 100 MG/ML IJ SOLN
INTRAMUSCULAR | Status: DC | PRN
  Administered 2019-06-14 (×2): 25 mg via INTRAVENOUS

## 2019-06-14 MED ORDER — SODIUM CHLORIDE 0.9 % IV SOLN
INTRAVENOUS | Status: DC
  Administered 2019-06-14: 10:00:00 via INTRAVENOUS

## 2019-06-14 MED ORDER — ONDANSETRON HCL 4 MG/2ML IJ SOLN
INTRAMUSCULAR | Status: DC | PRN
  Administered 2019-06-14: 4 mg via INTRAVENOUS

## 2019-06-14 MED ORDER — STERILE WATER FOR IRRIGATION IR SOLN
Status: DC | PRN
  Administered 2019-06-14: 1.5 mL

## 2019-06-14 MED ORDER — MIDAZOLAM HCL 5 MG/5ML IJ SOLN
INTRAMUSCULAR | Status: DC | PRN
  Administered 2019-06-14 (×2): 2 mg via INTRAVENOUS
  Administered 2019-06-14: 1 mg via INTRAVENOUS

## 2019-06-14 MED ORDER — MIDAZOLAM HCL 5 MG/5ML IJ SOLN
INTRAMUSCULAR | Status: AC
  Filled 2019-06-14: qty 10

## 2019-06-14 NOTE — Discharge Instructions (Signed)
You have internal and external hemorrhoids and diverticulosis IN YOUR LEFT AND RIGHT COLON. YOU HAD THREE SMALL POLYPS REMOVED.    EAT TO LIVE AND THINK OF FOOD AS MEDICINE. 75% OF YOUR PLATE SHOULD BE FRUITS/VEGGIES.  To have more energy, and to lose weight:      1. CONTINUE YOUR WEIGHT LOSS EFFORTS. YOUR BODY MASS INDEX IS OVER 30 WHICH MEANS YOU ARE OBESE. OBESITY IS ASSOCIATED WITH AN INCREASED FOR POLYPS, CIRRHOSIS, AND ALL CANCERS, INCLUDING ESOPHAGEAL AND COLON CANCER. I RECOMMEND YOU READ AND FOLLOW RECOMMENDATIONS BY DR. MARK HYMAN, "10-DAY DETOX DIET".    2. If you must eat bread, EAT EZEKIEL BREAD. IT IS IN THE FROZEN SECTION OF THE GROCERY STORE.    3. DRINK WATER WITH FRUIT OR CUCUMBER ADDED. YOUR URINE SHOULD BE LIGHT YELLOW. AVOID SODA, GATORADE, ENERGY DRINKS, OR DIET SODA.     4. AVOID HIGH FRUCTOSE CORN SYRUP AND CAFFEINE.     5. DO NOT chew SUGAR FREE GUM OR USE ARTIFICIAL SWEETENERS. IF NEEDED USE STEVIA AS A SWEETENER.    6. DO NOT EAT ENRICHED WHEAT FLOUR, PASTA, RICE, OR CEREAL.    7. ONLY EAT WILD CAUGHT SEAFOOD, GRASS FED BEEF OR CHICKEN, PORK FROM PASTURE RAISED PIGS, OR EGGS FROM PASTURE RAISED CHICKENS.    8. PRACTICE CHAIR YOGA FOR 15-30 MINS 3 OR 4 TIMES A WEEK AND PROGRESS TO HATHA YOGA OVER NEXT 6 MOS.    9. START TAKING A MULTIVITAMIN, VITAMIN B12, AND VITAMIN D3 2000 IU DAILY.   ADDITIONAL SUPPLEMENTS TO DECREASE CRAVING AND SUPPRESS YOUR APPETITE:    1. CINNAMON 500 MG EVERY AM PRIOR TO FIRST MEAL.   **STABILIZES BLOOD GLUCOSE**    2. CHROMIUM 400-500 MG WITH MEALS TWICE DAILY.    **FAT BURNER**    3. GREEN TEA EXTRACT ONE DAILY.   **SUPPRESSES YOUR APPETITE**    4. ALPHA LIPOIC ACID   **NATURAL ANTI-INFLAMMATORY SUPPLEMENT**   USE PREPARATION H FOUR TIMES  A DAY IF NEEDED TO RELIEVE RECTAL PAIN/PRESSURE/BLEEDING.  YOUR BIOPSY RESULTS WILL BE BACK IN 5 BUSINESS DAYS.  Next colonoscopy in 3 years.  Colonoscopy Care After Read the  instructions outlined below and refer to this sheet in the next week. These discharge instructions provide you with general information on caring for yourself after you leave the hospital. While your treatment has been planned according to the most current medical practices available, unavoidable complications occasionally occur. If you have any problems or questions after discharge, call DR. Theressa Piedra, [phone].  ACTIVITY  You may resume your regular activity, but move at a slower pace for the next 24 hours.   Take frequent rest periods for the next 24 hours.   Walking will help get rid of the air and reduce the bloated feeling in your belly (abdomen).   No driving for 24 hours (because of the medicine (anesthesia) used during the test).   You may shower.   Do not sign any important legal documents or operate any machinery for 24 hours (because of the anesthesia used during the test).    NUTRITION  Drink plenty of fluids.   You may resume your normal diet as instructed by your doctor.   Begin with a light meal and progress to your normal diet. Heavy or fried foods are harder to digest and may make you feel sick to your stomach (nauseated).   Avoid alcoholic beverages for 24 hours or as instructed.    MEDICATIONS  You may  resume your normal medications.   WHAT YOU CAN EXPECT TODAY  Some feelings of bloating in the abdomen.   Passage of more gas than usual.   Spotting of blood in your stool or on the toilet paper  .  IF YOU HAD POLYPS REMOVED DURING THE COLONOSCOPY:  Eat a soft diet IF YOU HAVE NAUSEA, BLOATING, ABDOMINAL PAIN, OR VOMITING.    FINDING OUT THE RESULTS OF YOUR TEST Not all test results are available during your visit. DR. Oneida Alar WILL CALL YOU WITHIN 14 DAYS OF YOUR PROCEDUE WITH YOUR RESULTS. Do not assume everything is normal if you have not heard from DR. Milanna Kozlov, CALL HER OFFICE AT [phone].  SEEK IMMEDIATE MEDICAL ATTENTION AND CALL THE OFFICE:  [phone] IF:  You have more than a spotting of blood in your stool.   Your belly is swollen (abdominal distention).   You are nauseated or vomiting.   You have a temperature over 101F.   You have abdominal pain or discomfort that is severe or gets worse throughout the day.  High-Fiber Diet A high-fiber diet changes your normal diet to include more whole grains, legumes, fruits, and vegetables. Changes in the diet involve replacing refined carbohydrates with unrefined foods. The calorie level of the diet is essentially unchanged. The Dietary Reference Intake (recommended amount) for adult males is 38 grams per day. For adult females, it is 25 grams per day. Pregnant and lactating women should consume 28 grams of fiber per day. Fiber is the intact part of a plant that is not broken down during digestion. Functional fiber is fiber that has been isolated from the plant to provide a beneficial effect in the body. PURPOSE  Increase stool bulk.   Ease and regulate bowel movements.   Lower cholesterol.   REDUCE RISK OF COLON CANCER  INDICATIONS THAT YOU NEED MORE FIBER  Constipation and hemorrhoids.   Uncomplicated diverticulosis (intestine condition) and irritable bowel syndrome.   Weight management.   As a protective measure against hardening of the arteries (atherosclerosis), diabetes, and cancer.   GUIDELINES FOR INCREASING FIBER IN THE DIET  Start adding fiber to the diet slowly. A gradual increase of about 5 more grams (2 slices of whole-wheat bread, 2 servings of most fruits or vegetables, or 1 bowl of high-fiber cereal) per day is best. Too rapid an increase in fiber may result in constipation, flatulence, and bloating.   Drink enough water and fluids to keep your urine clear or pale yellow. Water, juice, or caffeine-free drinks are recommended. Not drinking enough fluid may cause constipation.   Eat a variety of high-fiber foods rather than one type of fiber.   Try to  increase your intake of fiber through using high-fiber foods rather than fiber pills or supplements that contain small amounts of fiber.   The goal is to change the types of food eaten. Do not supplement your present diet with high-fiber foods, but replace foods in your present diet.   INCLUDE A VARIETY OF FIBER SOURCES  Replace refined and processed grains with whole grains, canned fruits with fresh fruits, and incorporate other fiber sources. White rice, white breads, and most bakery goods contain little or no fiber.   Brown whole-grain rice, buckwheat oats, and many fruits and vegetables are all good sources of fiber. These include: broccoli, Brussels sprouts, cabbage, cauliflower, beets, sweet potatoes, white potatoes (skin on), carrots, tomatoes, eggplant, squash, berries, fresh fruits, and dried fruits.   Cereals appear to be the richest source  of fiber. Cereal fiber is found in whole grains and bran. Bran is the fiber-rich outer coat of cereal grain, which is largely removed in refining. In whole-grain cereals, the bran remains. In breakfast cereals, the largest amount of fiber is found in those with "bran" in their names. The fiber content is sometimes indicated on the label.   You may need to include additional fruits and vegetables each day.   In baking, for 1 cup white flour, you may use the following substitutions:   1 cup whole-wheat flour minus 2 tablespoons.   1/2 cup white flour plus 1/2 cup whole-wheat flour.   Polyps, Colon  A polyp is extra tissue that grows inside your body. Colon polyps grow in the large intestine. The large intestine, also called the colon, is part of your digestive system. It is a long, hollow tube at the end of your digestive tract where your body makes and stores stool. Most polyps are not dangerous. They are benign. This means they are not cancerous. But over time, some types of polyps can turn into cancer. Polyps that are smaller than a pea are  usually not harmful. But larger polyps could someday become or may already be cancerous. To be safe, doctors remove all polyps and test them.   PREVENTION There is not one sure way to prevent polyps. You might be able to lower your risk of getting them if you:  Eat more fruits and vegetables and less fatty food.   Do not smoke.   Avoid alcohol.   Exercise every day.   Lose weight if you are overweight.   Eating more calcium and folate can also lower your risk of getting polyps. Some foods that are rich in calcium are milk, cheese, and broccoli. Some foods that are rich in folate are chickpeas, kidney beans, and spinach.    Diverticulosis Diverticulosis is a common condition that develops when small pouches (diverticula) form in the wall of the colon. The risk of diverticulosis increases with age. It happens more often in people who eat a low-fiber diet. Most individuals with diverticulosis have no symptoms. Those individuals with symptoms usually experience belly (abdominal) pain, constipation, or loose stools (diarrhea).  HOME CARE INSTRUCTIONS  Increase the amount of fiber in your diet as directed by your caregiver or dietician. This may reduce symptoms of diverticulosis.   Drink at least 6 to 8 glasses of water each day to prevent constipation.   Try not to strain when you have a bowel movement.   Avoiding nuts and seeds to prevent complications is NOT NECESSARY.   FOODS HAVING HIGH FIBER CONTENT INCLUDE:  Fruits. Apple, peach, pear, tangerine, raisins, prunes.   Vegetables. Brussels sprouts, asparagus, broccoli, cabbage, carrot, cauliflower, romaine lettuce, spinach, summer squash, tomato, winter squash, zucchini.   Starchy Vegetables. Baked beans, kidney beans, lima beans, split peas, lentils, potatoes (with skin).   Grains. Whole wheat bread, brown rice, bran flake cereal, plain oatmeal, white rice, shredded wheat, bran muffins.   SEEK IMMEDIATE MEDICAL CARE IF:  You  develop increasing pain or severe bloating.   You have an oral temperature above 101F.   You develop vomiting or bowel movements that are bloody or black.   Hemorrhoids Hemorrhoids are dilated (enlarged) veins around the rectum. Sometimes clots will form in the veins. This makes them swollen and painful. These are called thrombosed hemorrhoids. Causes of hemorrhoids include:  Constipation.   Straining to have a bowel movement.   HEAVY LIFTING  HOME CARE INSTRUCTIONS  Eat a well balanced diet and drink 6 to 8 glasses of water every day to avoid constipation. You may also use a bulk laxative.   Avoid straining to have bowel movements.   Keep anal area dry and clean.   Do not use a donut shaped pillow or sit on the toilet for long periods. This increases blood pooling and pain.   Move your bowels when your body has the urge; this will require less straining and will decrease pain and pressure.

## 2019-06-14 NOTE — H&P (Signed)
Primary Care Physician:  Alycia Rossetti, MD Primary Gastroenterologist:  Dr. Oneida Alar  Pre-Procedure History & Physical: HPI:  Karen Huynh is a 68 y.o. female here for  PERSONAL HISTORY OF POLYPS.  Past Medical History:  Diagnosis Date  . Bronchitis   . Constipation   . H/O: rheumatic fever    as child  . Heart murmur   . High cholesterol   . History of pneumonia   . Leukopenia   . Leukopenia 06/17/2013  . Muscle cramps 08/12/2013  . OA (osteoarthritis of spine)   . Thoracic spine pain 09/03/2014    Past Surgical History:  Procedure Laterality Date  . COLONOSCOPY N/A 09/11/2015   Procedure: COLONOSCOPY;  Surgeon: Danie Binder, MD;  Location: [facility];  Service: Endoscopy;  Laterality: N/A;  1030 - moved to 12:00 - pt knows to arrive at 11:00  . COLONOSCOPY W/ BIOPSIES AND POLYPECTOMY  SEP 2010    SIMPLE ADENOMA(1)    Prior to Admission medications   Medication Sig Start Date End Date Taking? Authorizing Provider  Ascorbic Acid (VITAMIN C) 1000 MG tablet Take 1,000 mg by mouth daily.   Yes [provider]  Cholecalciferol (VITAMIN D) 2000 UNITS CAPS Take 2,000 Units by mouth daily.    Yes [provider]  hydrochlorothiazide (HYDRODIURIL) 25 MG tablet Take 1 tablet (25 mg total) by mouth daily. 05/22/19  Yes [place], Modena Nunnery, MD  HYDROcodone-acetaminophen (NORCO/VICODIN) 5-325 MG tablet Take 1 tablet by mouth every 6 (six) hours as needed for moderate pain. 05/22/19  Yes [place], Modena Nunnery, MD  saccharomyces boulardii (FLORASTOR) 250 MG capsule Take 250 mg by mouth daily.    Yes [provider]  simvastatin (ZOCOR) 40 MG tablet Take 1 tablet (40 mg total) by mouth daily. 05/22/19  Yes [place], Modena Nunnery, MD  cyclobenzaprine (FLEXERIL) 10 MG tablet Take 1 tablet (10 mg total) by mouth 3 (three) times daily as needed for muscle spasms. 05/22/19   Alycia Rossetti, MD    Allergies as of 04/29/2019 - Review Complete 04/24/2019  Allergen Reaction  Noted  . Penicillins  08/12/2013    Family History  Problem Relation Age of Onset  . Colon cancer Mother   . Cancer Mother   . Aneurysm Father   . Cancer Maternal Aunt   . Cancer Other        niece with breast cancer  . Cancer Brother        prostate  . Kidney failure Maternal Grandmother   . Dementia Maternal Grandfather   . Congestive Heart Failure Sister   . COPD Sister   . Hypertension Sister   . Asthma Brother   . Dementia Maternal Uncle   . Dementia Maternal Aunt   . Other Son        MVA  . Colon polyps Neg Hx     Social History   Socioeconomic History  . Marital status: Married    Spouse name: Not on file  . Number of children: Not on file  . Years of education: Not on file  . Highest education level: Not on file  Occupational History  . Not on file  Social Needs  . Financial resource strain: Not on file  . Food insecurity    Worry: Not on file    Inability: Not on file  . Transportation needs    Medical: Not on file    Non-medical: Not on file  Tobacco Use  . Smoking status:  Former Smoker    Packs/day: 0.00    Years: 47.00    Pack years: 0.00    Types: Cigarettes    Quit date: 08/28/2018    Years since quitting: 0.7  . Smokeless tobacco: Never Used  . Tobacco comment: smokes 2 cig daily  Substance and Sexual Activity  . Alcohol use: No  . Drug use: No  . Sexual activity: Yes    Birth control/protection: Post-menopausal  Lifestyle  . Physical activity    Days per week: Not on file    Minutes per session: Not on file  . Stress: Not on file  Relationships  . Social Herbalist on phone: Not on file    Gets together: Not on file    Attends religious service: Not on file    Active member of club or organization: Not on file    Attends meetings of clubs or organizations: Not on file    Relationship status: Not on file  . Intimate partner violence    Fear of current or ex partner: Not on file    Emotionally abused: Not on file     Physically abused: Not on file    Forced sexual activity: Not on file  Other Topics Concern  . Not on file  Social History Narrative  . Not on file    Review of Systems: See HPI, otherwise negative ROS   Physical Exam: BP 123/66   Pulse 75   Temp 99 F (37.2 C) (Oral)   Resp 12   Ht 5\' 6"  (1.676 m)   Wt 86.2 kg   SpO2 100%   BMI 30.67 kg/m  General:   Alert,  pleasant and cooperative in NAD Head:  Normocephalic and atraumatic. Neck:  Supple; Lungs:  Clear throughout to auscultation.    Heart:  Regular rate and rhythm. Abdomen:  Soft, nontender and nondistended. Normal bowel sounds, without guarding, and without rebound.   Neurologic:  Alert and  oriented x4;  grossly normal neurologically.  Impression/Plan:     PERSONAL HISTORY OF POLYPS.  Plan:  1. TCS TODAY DISCUSSED PROCEDURE, BENEFITS, & RISKS: < 1% chance of medication reaction, bleeding, perforation, ASPIRATION, or rupture of spleen/liver requiring surgery to fix it and missed polyps < 1 cm 10-20% of the time.

## 2019-06-14 NOTE — Op Note (Signed)
[facility] Patient Name: Karen Huynh Procedure Date: 06/14/2019 11:23 AM MRN: [id number] Date of Birth: 1951-08-06 Attending MD: Barney Drain MD, MD CSN: [id number] Age: 68 Admit Type: Outpatient Procedure:                Colonoscopy WITH COLD SNARE POLYPECTOMY Indications:              Personal history of colonic polyps. LAST TCS 2017                            HAD RECAL WITH DEMEROL 100 MG/VERSED 4 MG IV Providers:                Barney Drain MD, MD, Charlsie Quest. Theda Sers RN, RN,                            Nelma Rothman, Technician Referring MD:             Modena Nunnery. [place] Medicines:                Meperidine 50 mg IV, Midazolam 5 mg IV, Ondansetron                            4 mg IV Complications:            No immediate complications. Estimated Blood Loss:     Estimated blood loss was minimal. Procedure:                Pre-Anesthesia Assessment:                           - Prior to the procedure, a History and Physical                            was performed, and patient medications and                            allergies were reviewed. The patient's tolerance of                            previous anesthesia was also reviewed. The risks                            and benefits of the procedure and the sedation                            options and risks were discussed with the patient.                            All questions were answered, and informed consent                            was obtained. Prior Anticoagulants: The patient has                            taken no previous anticoagulant or antiplatelet  agents. ASA Grade Assessment: II - A patient with                            mild systemic disease. After reviewing the risks                            and benefits, the patient was deemed in                            satisfactory condition to undergo the procedure.                            After obtaining informed consent, the  colonoscope                            was passed under direct vision. Throughout the                            procedure, the patient's blood pressure, pulse, and                            oxygen saturations were monitored continuously. The                            [id number]-[id number]) scope was introduced through                            the anus and advanced to the the cecum, identified                            by appendiceal orifice and ileocecal valve. The                            colonoscopy was somewhat difficult due to a                            tortuous colon. Successful completion of the                            procedure was aided by straightening and shortening                            the scope to obtain bowel loop reduction and                            COLOWRAP. The patient tolerated the procedure                            fairly well. The quality of the bowel preparation                            was excellent. The ileocecal valve, appendiceal  orifice, and rectum were photographed. Scope In: 11:47:37 AM Scope Out: 12:07:49 PM Scope Withdrawal Time: 0 hours 15 minutes 28 seconds  Total Procedure Duration: 0 hours 20 minutes 12 seconds  Findings:      Three sessile polyps were found in the sigmoid colon and descending       colon(2). The polyps were 2 to 4 mm in size. These polyps were removed       with a cold snare. Resection and retrieval were complete.      Multiple small and large-mouthed diverticula were found in the       recto-sigmoid colon, sigmoid colon and ascending colon.      External and internal hemorrhoids were found.      The recto-sigmoid colon, sigmoid colon and descending colon were       moderately tortuous. Impression:               - Three 2 to 4 mm polyps in the sigmoid colon and                            in the descending colon, removed with a cold snare.                            Resected and  retrieved.                           - Diverticulosis in the recto-sigmoid colon, in the                            sigmoid colon and in the ascending colon.                           - External and internal hemorrhoids.                           - Tortuous colon. Moderate Sedation:      Moderate (conscious) sedation was administered by the endoscopy nurse       and supervised by the endoscopist. The following parameters were       monitored: oxygen saturation, heart rate, blood pressure, and response       to care. Total physician intraservice time was 32 minutes. Recommendation:           - Patient has a contact number available for                            emergencies. The signs and symptoms of potential                            delayed complications were discussed with the                            patient. Return to normal activities tomorrow.                            Written discharge instructions were provided to the  patient.                           - High fiber diet.                           - Continue present medications.                           - Await pathology results.                           - Repeat colonoscopy in 3 years for surveillance. Procedure Code(s):        --- Professional ---                           [phone], Colonoscopy, flexible; with removal of                            tumor(s), polyp(s), or other lesion(s) by snare                            technique                           99153, Moderate sedation; each additional 15                            minutes intraservice time                           G0500, Moderate sedation services provided by the                            same physician or other qualified health care                            professional performing a gastrointestinal                            endoscopic service that sedation supports,                            requiring the presence of an  independent trained                            observer to assist in the monitoring of the                            patient's level of consciousness and physiological                            status; initial 15 minutes of intra-service time;                            patient age 39 years or older (additional time 49  be reported with [phone], as appropriate) Diagnosis Code(s):        --- Professional ---                           K63.5, Polyp of colon                           K64.8, Other hemorrhoids                           Z86.010, Personal history of colonic polyps                           K57.30, Diverticulosis of large intestine without                            perforation or abscess without bleeding                           Q43.8, Other specified congenital malformations of                            intestine CPT copyright 2019 American Medical Association. All rights reserved. The codes documented in this report are preliminary and upon coder review may  be revised to meet current compliance requirements. Barney Drain, MD Barney Drain MD, MD 06/14/2019 12:20:02 PM This report has been signed electronically. Number of Addenda: 0

## 2019-06-17 LAB — SURGICAL PATHOLOGY

## 2019-06-21 ENCOUNTER — Encounter (HOSPITAL_COMMUNITY): Payer: Self-pay | Admitting: Gastroenterology

## 2019-06-29 ENCOUNTER — Telehealth: Payer: Self-pay | Admitting: Gastroenterology

## 2019-06-29 NOTE — Telephone Encounter (Signed)
Please call pt. She had TWO simple adenomas AND ONE HYPERPLASTIC POLYP removed from her colon. NEXT TCS in 5 YEARS NOT 3 YEAR.  FOLLOW A High fiber diet.

## 2019-07-01 NOTE — Telephone Encounter (Signed)
Mailed letter with info.

## 2019-07-02 NOTE — Telephone Encounter (Signed)
Reminder in epic °

## 2019-07-10 ENCOUNTER — Other Ambulatory Visit (HOSPITAL_COMMUNITY): Payer: Self-pay | Admitting: Family Medicine

## 2019-07-10 DIAGNOSIS — Z1231 Encounter for screening mammogram for malignant neoplasm of breast: Secondary | ICD-10-CM

## 2019-07-11 ENCOUNTER — Other Ambulatory Visit: Payer: Self-pay

## 2019-07-12 ENCOUNTER — Encounter: Payer: Self-pay | Admitting: Family Medicine

## 2019-07-12 ENCOUNTER — Ambulatory Visit (INDEPENDENT_AMBULATORY_CARE_PROVIDER_SITE_OTHER): Payer: BC Managed Care – PPO | Admitting: Family Medicine

## 2019-07-12 VITALS — BP 126/68 | HR 74 | Temp 98.4°F | Resp 14 | Ht 64.0 in | Wt 184.0 lb

## 2019-07-12 DIAGNOSIS — R609 Edema, unspecified: Secondary | ICD-10-CM | POA: Diagnosis not present

## 2019-07-12 DIAGNOSIS — M79605 Pain in left leg: Secondary | ICD-10-CM

## 2019-07-12 DIAGNOSIS — M79604 Pain in right leg: Secondary | ICD-10-CM | POA: Diagnosis not present

## 2019-07-12 DIAGNOSIS — I83813 Varicose veins of bilateral lower extremities with pain: Secondary | ICD-10-CM

## 2019-07-12 MED ORDER — GABAPENTIN 300 MG PO CAPS: 300.0000 mg | ORAL_CAPSULE | Freq: Every day | ORAL | 3 refills | Status: DC

## 2019-07-12 NOTE — Patient Instructions (Addendum)
Compression hose prescription given  Start gabapentin  300 mg at bedtime  [place] to be done on your legs  F/U as previous

## 2019-07-12 NOTE — Progress Notes (Signed)
   Subjective:    Patient ID: Karen Huynh, female    DOB: 06/23/1951, 68 y.o.   MRN: [id number]  Patient presents for BLE Pain (nerve pain in B lower legs, also has restless legs at night)   Patient here with bilat lower leg pain, has also had some swelling in her feet.  The pain is mostly over her shin region and her veins.  This has been going on for the past month.  At nighttime her legs sometimes feel restless.  She states that her legs swell if she is sitting most of her day.  She gets sensations like something is crawling underneath the skin around the veins as well.  She does have an occasional burning sensation on her legs and feet Some swelling in her feet  She feels like this is different from her back pain  Weight 10lbs since Sept , she has cut down carbs/sweets since our last visit her A1c returned  6.1%      Review Of Systems:  GEN- denies fatigue, fever, weight loss,weakness, recent illness HEENT- denies eye drainage, change in vision, nasal discharge, CVS- denies chest pain, palpitations RESP- denies SOB, cough, wheeze ABD- denies N/V, change in stools, abd pain GU- denies dysuria, hematuria, dribbling, incontinence MSK- denies joint pain, +muscle aches, injury Neuro- denies headache, dizziness, syncope, seizure activity       Objective:    BP 126/68   Pulse 74   Temp 98.4 F (36.9 C) (Temporal)   Resp 14   Ht 5\' 4"  (1.626 m)   Wt 184 lb (83.5 kg)   SpO2 97%   BMI 31.58 kg/m  GEN- NAD, alert and oriented x3 CVS- RRR, no murmur RESP-CTAB ABD-NABS,soft,NT,ND EXT- ,.  Varicose veins spider veins mild swelling over the shins and around the ankles. Pulses- Radial, DP- 2+.  Dose.  Tibialis palpated        Assessment & Plan:      Problem List Items Addressed This Visit    None    Visit Diagnoses    Peripheral edema    -  Primary   Concern leg pain is from her veins and venous insuffiency, will get compression hose, continue HCTZ, will check blood  flow LE via dupplex, she has some neuropathic pain and states she does have degenerative disc disease with nerve impingement noted on MRI possible some of this could be coming from her back as well.  We will start her on gabapentin 300 mg at bedtime for that component.  She does not want any surgical intervention for her back.   Varicose veins of both lower extremities with pain       Bilateral leg pain          Note: This dictation was prepared with Dragon dictation along with smaller phrase technology. Any transcriptional errors that result from this process are unintentional.

## 2019-08-19 ENCOUNTER — Ambulatory Visit (HOSPITAL_COMMUNITY)
Admission: RE | Admit: 2019-08-19 | Discharge: 2019-08-19 | Disposition: A | Discharge status: Home / Self Care | Payer: BC Managed Care – PPO | Source: Ambulatory Visit | Attending: Family Medicine | Admitting: Family Medicine

## 2019-08-19 ENCOUNTER — Other Ambulatory Visit: Payer: Self-pay

## 2019-08-19 DIAGNOSIS — Z1231 Encounter for screening mammogram for malignant neoplasm of breast: Secondary | ICD-10-CM | POA: Diagnosis present

## 2019-10-12 ENCOUNTER — Ambulatory Visit: Payer: BC Managed Care – PPO | Attending: Internal Medicine

## 2019-10-12 DIAGNOSIS — Z23 Encounter for immunization: Secondary | ICD-10-CM | POA: Insufficient documentation

## 2019-10-12 NOTE — Progress Notes (Signed)
   Covid-19 Vaccination Clinic  Name:  Karen Huynh    MRN: [id number] DOB: [birth year]  10/12/2019  [name] was observed post Covid-19 immunization for 15 minutes without incidence. She was provided with Vaccine Information Sheet and instruction to access the V-Safe system.   [name] was instructed to call 911 with any severe reactions post vaccine: Marland Kitchen Difficulty breathing  . Swelling of your face and throat  . A fast heartbeat  . A bad rash all over your body  . Dizziness and weakness    Immunizations Administered    Name Date Dose VIS Date Route   Moderna COVID-19 Vaccine 10/12/2019 12:02 PM 0.5 mL 07/30/2019 Intramuscular   Manufacturer: Moderna   Lot: [id number]   [id number]

## 2019-11-07 ENCOUNTER — Telehealth: Payer: Self-pay | Admitting: Adult Health

## 2019-11-07 NOTE — Telephone Encounter (Signed)

## 2019-11-08 ENCOUNTER — Ambulatory Visit (INDEPENDENT_AMBULATORY_CARE_PROVIDER_SITE_OTHER): Payer: BC Managed Care – PPO | Admitting: Adult Health

## 2019-11-08 ENCOUNTER — Encounter: Payer: Self-pay | Admitting: Adult Health

## 2019-11-08 ENCOUNTER — Other Ambulatory Visit: Payer: Self-pay

## 2019-11-08 VITALS — BP 121/75 | HR 70 | Ht 63.25 in | Wt 179.0 lb

## 2019-11-08 DIAGNOSIS — Z716 Tobacco abuse counseling: Secondary | ICD-10-CM | POA: Diagnosis not present

## 2019-11-08 DIAGNOSIS — Z01419 Encounter for gynecological examination (general) (routine) without abnormal findings: Secondary | ICD-10-CM | POA: Diagnosis not present

## 2019-11-08 DIAGNOSIS — Z1211 Encounter for screening for malignant neoplasm of colon: Secondary | ICD-10-CM | POA: Diagnosis not present

## 2019-11-08 DIAGNOSIS — Z1212 Encounter for screening for malignant neoplasm of rectum: Secondary | ICD-10-CM | POA: Diagnosis not present

## 2019-11-08 LAB — HEMOCCULT GUIAC POC 1CARD (OFFICE): Fecal Occult Blood, POC: NEGATIVE

## 2019-11-08 MED ORDER — CHANTIX STARTING MONTH PAK 0.5 MG X 11 & 1 MG X 42 PO TABS: ORAL_TABLET | ORAL | 0 refills | Status: DC

## 2019-11-08 NOTE — Progress Notes (Addendum)
Patient ID: Karen Huynh, female   DOB: 06-14-1951, 69 y.o.   MRN: [id number] History of Present Illness: Karen Huynh is a 69 year old black female,married,PM, works [employer] and is active, in for well woman gyn exam.She had a normal pap with negative HPV 12/05/2018.She had quit smoking and sister died and she started back she requests Chantix, to stop smoking.  PCP is Dr Karen Huynh.   Current Medications, Allergies, Past Medical History, Past Surgical History, Family History and Social History were reviewed in Standard record.     Review of Systems: Patient denies any headaches, hearing loss, fatigue, blurred vision, shortness of breath, chest pain, abdominal pain, problems with bowel movements, urination, or intercourse(not often). No mood swings. Left knee hurts at times and has back pain, has meds from PCP    Physical Exam:BP 121/75 (BP Location: Left Arm, Patient Position: Sitting, Cuff Size: Normal)   Pulse 70   Ht 5' 3.25" (1.607 m)   Wt 179 lb (81.2 kg)   BMI 31.46 kg/m  General:  Well developed, well nourished, no acute distress Skin:  Warm and dry Neck:  Midline trachea, normal thyroid, good ROM, no lymphadenopathy,no carotid bruits heard  Lungs; Clear to auscultation bilaterally Breast:  No dominant palpable mass, retraction, or nipple discharge Cardiovascular: Regular rate and rhythm Abdomen:  Soft, non tender, no hepatosplenomegaly Pelvic:  External genitalia is normal in appearance, no lesions.  The vagina is pale with fair moisture and loss of rugae. Urethra has no lesions or masses. The cervix is smooth.  Uterus is felt to be normal size, shape, and contour.  No adnexal masses or tenderness noted.Bladder is non tender, no masses felt. Rectal: Good sphincter tone, no polyps, or hemorrhoids felt.  Hemoccult negative. Extremities/musculoskeletal:  No swelling or varicosities noted, no clubbing or cyanosis Psych:  No mood changes, alert and cooperative,seems  happy Fall risk is low PHQ 2 score is 0. Examination chaperoned by Karen Pupa LPN  Impression and Plan: 1. Encounter for well woman exam with routine gynecological exam Pap and physical in 2 years  Labs with PCP  Mammogram yearly   2. Screening for colorectal cancer Colonoscopy per GI   3. Encounter for smoking cessation counseling Will rx chantix, counseled Meds ordered this encounter  Medications  . varenicline (CHANTIX STARTING MONTH PAK) 0.5 MG X 11 & 1 MG X 42 tablet    Sig: Take one 0.5 mg tablet by mouth once daily for 3 days, then increase to one 0.5 mg tablet twice daily for 4 days, then increase to one 1 mg tablet twice daily.    Dispense:  53 tablet    Refill:  0    Order Specific Question:   Supervising Provider    Answer:   Tania Ade H [[id number]]

## 2019-11-09 ENCOUNTER — Ambulatory Visit: Payer: Self-pay | Attending: Internal Medicine

## 2019-11-09 DIAGNOSIS — Z23 Encounter for immunization: Secondary | ICD-10-CM

## 2019-11-09 NOTE — Progress Notes (Signed)
   Covid-19 Vaccination Clinic  Name:  Karen Huynh    MRN: [id number] DOB: Nov 11, 1950  11/09/2019  Ms. [name] was observed post Covid-19 immunization for 15 minutes without incident. She was provided with Vaccine Information Sheet and instruction to access the V-Safe system.   Ms. [name] was instructed to call 911 with any severe reactions post vaccine: Marland Kitchen Difficulty breathing  . Swelling of face and throat  . A fast heartbeat  . A bad rash all over body  . Dizziness and weakness   Immunizations Administered    Name Date Dose VIS Date Route   Pfizer COVID-19 Vaccine 11/09/2019 10:37 AM 0.3 mL 08/09/2019 Intramuscular   Manufacturer: [place]   Lot: [id number]   [place]: [id number]

## 2019-11-19 ENCOUNTER — Ambulatory Visit (INDEPENDENT_AMBULATORY_CARE_PROVIDER_SITE_OTHER): Payer: BC Managed Care – PPO | Admitting: Family Medicine

## 2019-11-19 ENCOUNTER — Encounter: Payer: Self-pay | Admitting: Family Medicine

## 2019-11-19 ENCOUNTER — Other Ambulatory Visit: Payer: Self-pay

## 2019-11-19 VITALS — BP 120/68 | HR 78 | Temp 98.1°F | Resp 14 | Ht 63.25 in | Wt 181.0 lb

## 2019-11-19 DIAGNOSIS — Z0000 Encounter for general adult medical examination without abnormal findings: Secondary | ICD-10-CM

## 2019-11-19 DIAGNOSIS — Z0001 Encounter for general adult medical examination with abnormal findings: Secondary | ICD-10-CM | POA: Diagnosis not present

## 2019-11-19 DIAGNOSIS — R7303 Prediabetes: Secondary | ICD-10-CM

## 2019-11-19 DIAGNOSIS — E6609 Other obesity due to excess calories: Secondary | ICD-10-CM

## 2019-11-19 DIAGNOSIS — Z6831 Body mass index (BMI) 31.0-31.9, adult: Secondary | ICD-10-CM

## 2019-11-19 DIAGNOSIS — I10 Essential (primary) hypertension: Secondary | ICD-10-CM | POA: Diagnosis not present

## 2019-11-19 DIAGNOSIS — M8589 Other specified disorders of bone density and structure, multiple sites: Secondary | ICD-10-CM

## 2019-11-19 DIAGNOSIS — M5136 Other intervertebral disc degeneration, lumbar region: Secondary | ICD-10-CM | POA: Diagnosis not present

## 2019-11-19 DIAGNOSIS — E782 Mixed hyperlipidemia: Secondary | ICD-10-CM | POA: Diagnosis not present

## 2019-11-19 DIAGNOSIS — E559 Vitamin D deficiency, unspecified: Secondary | ICD-10-CM

## 2019-11-19 NOTE — Progress Notes (Signed)
Subjective:   Patient presents for Medicare Annual/Subsequent preventive examination.    Pt here for Wellness exam, medications and history reviewed    Borderline DM, last checked in Sept 2020 , A1C was 6.1%  Hyperlipidemia, last LDL  115    She is now on Chantix she had started smoking again has not smoked in the past few weeks   She is scheduled for eye visit in July, needs new glasses   No new concerns  Needs refill on pain meds uses sparingly for chronic pain, also taking gabapentin, flexeril prn   Review Past Medical/Family/Social: per EMR    Risk Factors  Current exercise habits: started walking  Dietary issues discussed: Yes  Cardiac risk factors: Obesity (BMI >= 30 kg/m2).  HTN, Borderline DM, Hyperlipidemia   Depression Screen  (Note: if answer to either of the following is "Yes", a more complete depression screening is indicated)  Over the past two weeks, have you felt down, depressed or hopeless? No Over the past two weeks, have you felt little interest or pleasure in doing things? No Have you lost interest or pleasure in daily life? No Do you often feel hopeless? No Do you cry easily over simple problems? No   Activities of Daily Living  In your present state of health, do you have any difficulty performing the following activities?:  Driving? No  Managing money? No  Feeding yourself? No  Getting from bed to chair? No  Climbing a flight of stairs? No  Preparing food and eating?: No  Bathing or showering? No  Getting dressed: No  Getting to the toilet? No  Using the toilet:No  Moving around from place to place: No  In the past year have you fallen or had a near fall?:No  Are you sexually active? No  Do you have more than one partner? No   Hearing Difficulties: No  Do you often ask people to speak up or repeat themselves? No  Do you experience ringing or noises in your ears? No Do you have difficulty understanding soft or whispered voices? No  Do you  feel that you have a problem with memory? No Do you often misplace items? No  Do you feel safe at home? Yes  Cognitive Testing  Alert? Yes Normal Appearance?Yes  Oriented to person? Yes Place? Yes  Time? Yes  Recall of three objects? Yes  Can perform simple calculations? Yes  Displays appropriate judgment?Yes  Can read the correct time from a watch face?Yes   List the Names of Other Physician/Practitioners you currently use:   [facility]  [facility]   Screening Tests / Date Colonoscopy    UTD                  Zostavax UTD COVID-19 Vaccine UTD PNA Vaccine UTD Mammogram  UTD  Bone Density UTD  Influenza Vaccine  UTD Tetanus/tdap   ROS: GEN- denies fatigue, fever, weight loss,weakness, recent illness HEENT- denies eye drainage, change in vision, nasal discharge, CVS- denies chest pain, palpitations RESP- denies SOB, cough, wheeze ABD- denies N/V, change in stools, abd pain GU- denies dysuria, hematuria, dribbling, incontinence MSK- denies joint pain, muscle aches, injury Neuro- denies headache, dizziness, syncope, seizure activity  Physical- vitals reviewed  GEN- NAD, alert and oriented x3 HEENT- PERRL, EOMI, non injected sclera, pink conjunctiva, MMM, oropharynx clear Neck- Supple, no thryomegaly, no bruit  CVS- RRR, no murmur RESP-CTAB ABD-nabs,soft,NT,ND EXT- No edema Pulses- Radial, DP- 2+   Assessment:  Annual wellness medicare exam   Plan:    During the course of the visit the patient was educated and counseled about appropriate screening and preventive services including:   Wellness exam completed.  Prevention is up-to-date.  Hypertension blood pressure controlled no changes to medication.  For lipidemia continue statin drug she return in the morning for fasting labs will also have her A1c checked.  Obesity class I she has been working on dietary changes now starting exercise.  Her weight is down from her highest at 190 last  year  Tobacco cessation she is currently on Chantix  Chronic back pain degenerative disc disease lumbar spine we will continue gabapentin Flexeril and hydrocodone which she uses sparingly.  Given handout on advanced directives. We noticed that she was given 2 different types of the Covid vaccine however the lot numbers look very similar so this may just been a miss key.  I do not think that she needs another injection  Osteopenia- bone density UTD, on supplements for vitamin D def  weight bearing exercise   Diet review for nutrition referral? Yes ____ Not Indicated __x__  Patient Instructions (the written plan) was given to the patient.  Medicare Attestation  I have personally reviewed:  The patient's medical and social history  Their use of alcohol, tobacco or illicit drugs  Their current medications and supplements  The patient's functional ability including ADLs,fall risks, home safety risks, cognitive, and hearing and visual impairment  Diet and physical activities  Evidence for depression or mood disorders  The patient's weight, height, BMI, and visual acuity have been recorded in the chart. I have made referrals, counseling, and provided education to the patient based on review of the above and I have provided the patient with a written personalized care plan for preventive services.

## 2019-11-19 NOTE — Patient Instructions (Signed)
Return in the morning for fasting labs Medications refilled F/U 6 months

## 2019-11-21 ENCOUNTER — Other Ambulatory Visit: Payer: Medicare Other

## 2019-11-21 ENCOUNTER — Other Ambulatory Visit: Payer: Self-pay

## 2019-11-21 DIAGNOSIS — E559 Vitamin D deficiency, unspecified: Secondary | ICD-10-CM

## 2019-11-21 DIAGNOSIS — E782 Mixed hyperlipidemia: Secondary | ICD-10-CM

## 2019-11-21 DIAGNOSIS — R7303 Prediabetes: Secondary | ICD-10-CM

## 2019-11-21 DIAGNOSIS — I10 Essential (primary) hypertension: Secondary | ICD-10-CM

## 2019-11-22 ENCOUNTER — Other Ambulatory Visit: Payer: Self-pay | Admitting: *Deleted

## 2019-11-22 DIAGNOSIS — M5136 Other intervertebral disc degeneration, lumbar region: Secondary | ICD-10-CM

## 2019-11-22 LAB — CBC WITH DIFFERENTIAL/PLATELET
Absolute Monocytes: 425 cells/uL (ref 200–950)
Basophils Absolute: 72 cells/uL (ref 0–200)
Basophils Relative: 2 %
Eosinophils Absolute: 263 cells/uL (ref 15–500)
Eosinophils Relative: 7.3 %
HCT: 43.4 % (ref 35.0–45.0)
Hemoglobin: 14.3 g/dL (ref 11.7–15.5)
Lymphs Abs: 2257 cells/uL (ref 850–3900)
MCH: 28.5 pg (ref 27.0–33.0)
MCHC: 32.9 g/dL (ref 32.0–36.0)
MCV: 86.5 fL (ref 80.0–100.0)
MPV: 10 fL (ref 7.5–12.5)
Monocytes Relative: 11.8 %
Neutro Abs: 583 cells/uL — ABNORMAL LOW (ref 1500–7800)
Neutrophils Relative %: 16.2 %
Platelets: 271 10*3/uL (ref 140–400)
RBC: 5.02 10*6/uL (ref 3.80–5.10)
RDW: 13.1 % (ref 11.0–15.0)
Total Lymphocyte: 62.7 %
WBC: 3.6 10*3/uL — ABNORMAL LOW (ref 3.8–10.8)

## 2019-11-22 LAB — HEMOGLOBIN A1C
Hgb A1c MFr Bld: 5.9 % of total Hgb — ABNORMAL HIGH (ref <5.7)
Mean Plasma Glucose: 123 (calc)
eAG (mmol/L): 6.8 (calc)

## 2019-11-22 LAB — COMPREHENSIVE METABOLIC PANEL
AG Ratio: 2 (calc) (ref 1.0–2.5)
ALT: 21 U/L (ref 6–29)
AST: 19 U/L (ref 10–35)
Albumin: 4.2 g/dL (ref 3.6–5.1)
Alkaline phosphatase (APISO): 50 U/L (ref 37–153)
BUN: 23 mg/dL (ref 7–25)
CO2: 29 mmol/L (ref 20–32)
Calcium: 9.6 mg/dL (ref 8.6–10.4)
Chloride: 105 mmol/L (ref 98–110)
Creat: 0.79 mg/dL (ref 0.50–0.99)
Globulin: 2.1 g/dL (calc) (ref 1.9–3.7)
Glucose, Bld: 104 mg/dL — ABNORMAL HIGH (ref 65–99)
Potassium: 4.1 mmol/L (ref 3.5–5.3)
Sodium: 142 mmol/L (ref 135–146)
Total Bilirubin: 0.5 mg/dL (ref 0.2–1.2)
Total Protein: 6.3 g/dL (ref 6.1–8.1)

## 2019-11-22 LAB — LIPID PANEL
Cholesterol: 159 mg/dL (ref <200)
HDL: 46 mg/dL — ABNORMAL LOW (ref >=50)
LDL Cholesterol (Calc): 99 mg/dL (calc)
Non-HDL Cholesterol (Calc): 113 mg/dL (calc) (ref <130)
Total CHOL/HDL Ratio: 3.5 (calc) (ref <5.0)
Triglycerides: 58 mg/dL (ref <150)

## 2019-11-22 LAB — VITAMIN D 25 HYDROXY (VIT D DEFICIENCY, FRACTURES): Vit D, 25-Hydroxy: 22 ng/mL — ABNORMAL LOW (ref 30–100)

## 2019-11-22 MED ORDER — GABAPENTIN 300 MG PO CAPS: 300.0000 mg | ORAL_CAPSULE | Freq: Every day | ORAL | 3 refills | Status: DC

## 2019-11-22 MED ORDER — CYCLOBENZAPRINE HCL 10 MG PO TABS: 10.0000 mg | ORAL_TABLET | Freq: Three times a day (TID) | ORAL | 3 refills | Status: DC | PRN

## 2019-11-22 MED ORDER — HYDROCODONE-ACETAMINOPHEN 5-325 MG PO TABS: 1.0000 | ORAL_TABLET | Freq: Four times a day (QID) | ORAL | 0 refills | Status: DC | PRN

## 2019-11-22 MED ORDER — VITAMIN D (ERGOCALCIFEROL) 1.25 MG (50000 UNIT) PO CAPS: 50000.0000 [IU] | ORAL_CAPSULE | ORAL | 1 refills | Status: DC

## 2019-11-22 NOTE — Telephone Encounter (Signed)
Received call from patient.   Requested refill on Hydrocodone/APAP and Flexeril.   Ok to refill??  Last office visit 11/19/2019.  Last refill 05/22/2019 on both.

## 2019-12-10 ENCOUNTER — Ambulatory Visit: Payer: Self-pay | Admitting: Nurse Practitioner

## 2019-12-11 ENCOUNTER — Other Ambulatory Visit: Payer: Self-pay

## 2019-12-11 ENCOUNTER — Ambulatory Visit (INDEPENDENT_AMBULATORY_CARE_PROVIDER_SITE_OTHER): Payer: BC Managed Care – PPO | Admitting: Nurse Practitioner

## 2019-12-11 ENCOUNTER — Encounter: Payer: Self-pay | Admitting: Nurse Practitioner

## 2019-12-11 VITALS — BP 102/60 | HR 65 | Temp 97.6°F | Resp 18 | Wt 182.4 lb

## 2019-12-11 DIAGNOSIS — L237 Allergic contact dermatitis due to plants, except food: Secondary | ICD-10-CM

## 2019-12-11 MED ORDER — PREDNISONE 10 MG PO TABS: ORAL_TABLET | ORAL | 0 refills | Status: DC

## 2019-12-11 NOTE — Patient Instructions (Addendum)
Take prednisone (steroid) as directed. Once completed contact provider if new rash occurs or itching increases.  May take Benadryl at bedtime. May take Claritin in AM.  May take Pepcid 20 mg over the counter two times a day (Q12 hours).  May use over the counter remedies such as calamine lotion to relieve itching, do not use antihistamine topical. Do not scratch.  Seek medical attention for worsening or non resolving symptoms.

## 2019-12-11 NOTE — Progress Notes (Addendum)
Established Patient Office Visit  Subjective:  Patient ID: Karen Huynh, female    DOB: February 22, 1951  Age: 69 y.o. MRN: [id number]  CC:  Chief Complaint  Patient presents with  . Poison Ivy    all over, starrted 04/10, OTC meds used with little relief    HPI Karen Huynh is a 69 year old female presenting for rash. She worked in her yard carrying cut landscaping/plant debris on Sunday. She developed itchy bump rash the next morning that has increased to left eye lid, bilateral forearms, ble at ankles, and is very pruritic. She says that she knows it is was poison oak that she contacted. She has tried no treatment, her husband has mild areas.   Past Medical History:  Diagnosis Date  . Bronchitis   . Constipation   . H/O: rheumatic fever    as child  . Heart murmur   . High cholesterol   . History of pneumonia   . Leukopenia   . Leukopenia 06/17/2013  . Muscle cramps 08/12/2013  . OA (osteoarthritis of spine)   . Thoracic spine pain 09/03/2014    Past Surgical History:  Procedure Laterality Date  . COLONOSCOPY N/A 09/11/2015   Procedure: COLONOSCOPY;  Surgeon: Danie Binder, MD;  [facility]: [facility];  Service: Endoscopy;  Laterality: N/A;  1030 - moved to 12:00 - pt knows to arrive at 11:00  . COLONOSCOPY N/A 06/14/2019   Procedure: COLONOSCOPY;  Surgeon: Danie Binder, MD;  [facility]: [facility];  Service: Endoscopy;  Laterality: N/A;  11:15  . COLONOSCOPY W/ BIOPSIES AND POLYPECTOMY  SEP 2010    SIMPLE ADENOMA(1)  . POLYPECTOMY  06/14/2019   Procedure: POLYPECTOMY;  Surgeon: Danie Binder, MD;  Location: [facility];  Service: Endoscopy;;  sigmoid, descending colon    Family History  Problem Relation Age of Onset  . Colon cancer Mother        age > 38  . Cancer Mother   . Aneurysm Father   . Cancer Maternal Aunt   . Cancer Other        niece with breast cancer  . Cancer Brother        prostate  . Colon polyps Brother        age > 30, surgical  resection  . Kidney failure Maternal Grandmother   . Dementia Maternal Grandfather   . Congestive Heart Failure Sister   . COPD Sister   . Hypertension Sister   . Asthma Brother   . Dementia Maternal Uncle   . Dementia Maternal Aunt   . Other Son        MVA  . Cancer Sister     Social History   Socioeconomic History  . Marital status: Married    Spouse name: Not on file  . Number of children: Not on file  . Years of education: Not on file  . Highest education level: Not on file  Occupational History  . Not on file  Tobacco Use  . Smoking status: Former Smoker    Packs/day: 0.00    Years: 47.00    Pack years: 0.00    Types: Cigarettes    Quit date: 11/12/2019    Years since quitting: 0.0  . Smokeless tobacco: Never Used  Substance and Sexual Activity  . Alcohol use: No  . Drug use: No  . Sexual activity: Yes    Birth control/protection: Post-menopausal  Other Topics Concern  . Not on  file  Social History Narrative  . Not on file   Social Determinants of Health   Financial Resource Strain:   . Difficulty of Paying Living Expenses:   Food Insecurity:   . Worried About Charity fundraiser in the Last Year:   . Arboriculturist in the Last Year:   Transportation Needs:   . Film/video editor (Medical):   Marland Kitchen Lack of Transportation (Non-Medical):   Physical Activity:   . Days of Exercise per Week:   . Minutes of Exercise per Session:   Stress:   . Feeling of Stress :   Social Connections:   . Frequency of Communication with Friends and Family:   . Frequency of Social Gatherings with Friends and Family:   . Attends Religious Services:   . Active Member of Clubs or Organizations:   . Attends Archivist Meetings:   Marland Kitchen Marital Status:   Intimate Partner Violence:   . Fear of Current or Ex-Partner:   . Emotionally Abused:   Marland Kitchen Physically Abused:   . Sexually Abused:     Outpatient Medications Prior to Visit  Medication Sig Dispense Refill  .  Ascorbic Acid (VITAMIN C) 1000 MG tablet Take 1,000 mg by mouth daily.    . cyclobenzaprine (FLEXERIL) 10 MG tablet Take 1 tablet (10 mg total) by mouth 3 (three) times daily as needed for muscle spasms. 30 tablet 3  . gabapentin (NEURONTIN) 300 MG capsule Take 1 capsule (300 mg total) by mouth at bedtime. For nerve pain 30 capsule 3  . hydrochlorothiazide (HYDRODIURIL) 25 MG tablet Take 1 tablet (25 mg total) by mouth daily. 90 tablet 3  . HYDROcodone-acetaminophen (NORCO/VICODIN) 5-325 MG tablet Take 1 tablet by mouth every 6 (six) hours as needed for moderate pain. 60 tablet 0  . saccharomyces boulardii (FLORASTOR) 250 MG capsule Take 250 mg by mouth daily.     . simvastatin (ZOCOR) 40 MG tablet Take 1 tablet (40 mg total) by mouth daily. 90 tablet 3  . varenicline (CHANTIX STARTING MONTH PAK) 0.5 MG X 11 & 1 MG X 42 tablet Take one 0.5 mg tablet by mouth once daily for 3 days, then increase to one 0.5 mg tablet twice daily for 4 days, then increase to one 1 mg tablet twice daily. 53 tablet 0  . Vitamin D, Ergocalciferol, (DRISDOL) 1.25 MG (50000 UNIT) CAPS capsule Take 1 capsule (50,000 Units total) by mouth every 7 (seven) days. x6 months, then D/C. 12 capsule 1   No facility-administered medications prior to visit.    Allergies  Allergen Reactions  . Penicillins     Gives pt yeast infection. Has patient had a PCN reaction causing immediate rash, facial/tongue/throat swelling, SOB or lightheadedness with hypotension: No Has patient had a PCN reaction causing severe rash involving mucus membranes or skin necrosis: No Has patient had a PCN reaction that required hospitalization No Has patient had a PCN reaction occurring within the last 10 years: No If all of the above answers are "NO", then may proceed with Cephalosporin use.     ROS Review of Systems  All other systems reviewed and are negative.     Objective:    Physical Exam  Constitutional: She is oriented to person, place,  and time. She appears well-developed and well-nourished.  HENT:  Head: Normocephalic.  Eyes: Pupils are equal, round, and reactive to light. Conjunctivae and EOM are normal.  Cardiovascular: Normal rate.  Pulmonary/Chest: Effort normal.  Musculoskeletal:  General: Normal range of motion.     Cervical back: Normal range of motion and neck supple.  Neurological: She is alert and oriented to person, place, and time.  Skin: Skin is warm and dry. Rash noted. Rash is urticarial.     Poison oak rash left eyelid, bilateral forearms, chest, ble at ankles.  Psychiatric: She has a normal mood and affect. Her behavior is normal. Thought content normal.  Nursing note and vitals reviewed.   BP 102/60 (BP Location: Left Arm, Patient Position: Sitting, Cuff Size: Normal)   Pulse 65   Temp 97.6 F (36.4 C) (Temporal)   Resp 18   Wt 182 lb 6.4 oz (82.7 kg)   SpO2 96%   BMI 32.06 kg/m  Wt Readings from Last 3 Encounters:  12/11/19 182 lb 6.4 oz (82.7 kg)  11/19/19 181 lb (82.1 kg)  11/08/19 179 lb (81.2 kg)     Health Maintenance Due  Topic Date Due  . TETANUS/TDAP  07/27/2014    There are no preventive care reminders to display for this patient.  Lab Results  Component Value Date   TSH 1.050 11/29/2017   Lab Results  Component Value Date   WBC 3.6 (L) 11/21/2019   HGB 14.3 11/21/2019   HCT 43.4 11/21/2019   MCV 86.5 11/21/2019   PLT 271 11/21/2019   Lab Results  Component Value Date   NA 142 11/21/2019   K 4.1 11/21/2019   CO2 29 11/21/2019   GLUCOSE 104 (H) 11/21/2019   BUN 23 11/21/2019   CREATININE 0.79 11/21/2019   BILITOT 0.5 11/21/2019   ALKPHOS 67 11/29/2017   AST 19 11/21/2019   ALT 21 11/21/2019   PROT 6.3 11/21/2019   ALBUMIN 4.6 11/29/2017   CALCIUM 9.6 11/21/2019   Lab Results  Component Value Date   CHOL 159 11/21/2019   Lab Results  Component Value Date   HDL 46 (L) 11/21/2019   Lab Results  Component Value Date   LDLCALC 99 11/21/2019     Lab Results  Component Value Date   TRIG 58 11/21/2019   Lab Results  Component Value Date   CHOLHDL 3.5 11/21/2019   Lab Results  Component Value Date   HGBA1C 5.9 (H) 11/21/2019      Assessment & Plan:   Problem List Items Addressed This Visit    None    Visit Diagnoses    Contact dermatitis due to poison ivy    -  Primary    Take prednisone (steroid) as directed. Once completed contact provider if new rash occurs or itching increases.  May take Benadryl at bedtime. May take Claritin in AM.  May take Pepcid 20 mg over the counter two times a day (Q12 hours).  May use over the counter remedies such as calamine lotion to relieve itching, do not use antihistamine topical. Do not scratch.  Seek medical attention for worsening or non resolving symptoms.   Meds ordered this encounter  Medications  . predniSONE (DELTASONE) 10 MG tablet    Sig: Day one: take 6 tabs Day two: take 5 tabs Day three: take 4 tabs Day four: take 3 tabs Day five: take 2 tabs Day six: take 1 tab    Dispense:  21 tablet    Refill:  0    Follow-up: Return if symptoms worsen or fail to improve.    Annie Main, FNP

## 2019-12-17 ENCOUNTER — Other Ambulatory Visit: Payer: Self-pay | Admitting: Nurse Practitioner

## 2019-12-17 ENCOUNTER — Telehealth: Payer: Self-pay | Admitting: Family Medicine

## 2019-12-17 DIAGNOSIS — L237 Allergic contact dermatitis due to plants, except food: Secondary | ICD-10-CM

## 2019-12-17 MED ORDER — PREDNISONE 10 MG PO TABS: ORAL_TABLET | ORAL | 0 refills | Status: DC

## 2019-12-17 NOTE — Telephone Encounter (Signed)
I have sent to pharmacy.

## 2019-12-17 NOTE — Telephone Encounter (Signed)
Spoke with patient and informed her that another round of prednisone was called in. Patient verbalized understanding.

## 2019-12-17 NOTE — Telephone Encounter (Signed)
Patient called in stating that she was seen on last week 12/11/2019 with poison ivy. Patient that it has gotten better on prednisone however it has not completely resolved and it is still itching. Patient would like to know if another round of prednisone can be called in?

## 2020-02-17 ENCOUNTER — Other Ambulatory Visit: Payer: Self-pay | Admitting: Family Medicine

## 2020-02-18 ENCOUNTER — Other Ambulatory Visit: Payer: Self-pay

## 2020-02-18 MED ORDER — VITAMIN D (ERGOCALCIFEROL) 1.25 MG (50000 UNIT) PO CAPS: 50000.0000 [IU] | ORAL_CAPSULE | ORAL | 1 refills | Status: DC

## 2020-05-27 ENCOUNTER — Other Ambulatory Visit: Payer: Self-pay | Admitting: Family Medicine

## 2020-05-27 DIAGNOSIS — I10 Essential (primary) hypertension: Secondary | ICD-10-CM

## 2020-05-29 ENCOUNTER — Other Ambulatory Visit: Payer: Self-pay | Admitting: Family Medicine

## 2020-06-01 ENCOUNTER — Other Ambulatory Visit: Payer: Self-pay | Admitting: Family Medicine

## 2020-06-01 NOTE — Telephone Encounter (Signed)
Refill Simvastatin 40 mg [place], [place]

## 2020-06-01 NOTE — Telephone Encounter (Signed)
Medication refilled in prior encounter.

## 2020-06-29 ENCOUNTER — Other Ambulatory Visit: Payer: Self-pay | Admitting: Family Medicine

## 2020-06-29 DIAGNOSIS — I10 Essential (primary) hypertension: Secondary | ICD-10-CM

## 2020-06-29 MED ORDER — HYDROCHLOROTHIAZIDE 25 MG PO TABS: 25.0000 mg | ORAL_TABLET | Freq: Every day | ORAL | 3 refills | Status: DC

## 2020-06-29 NOTE — Telephone Encounter (Signed)
Prescription sent to pharmacy.

## 2020-06-29 NOTE — Telephone Encounter (Signed)
Hydrodiuril 25 mg     Ad[place], [place]

## 2020-07-09 ENCOUNTER — Other Ambulatory Visit: Payer: Self-pay | Admitting: *Deleted

## 2020-07-09 MED ORDER — SIMVASTATIN 40 MG PO TABS
40.0000 mg | ORAL_TABLET | Freq: Every day | ORAL | 1 refills | Status: DC
Start: 2020-07-09 — End: 2020-07-13

## 2020-07-13 ENCOUNTER — Telehealth: Payer: Self-pay | Admitting: Family Medicine

## 2020-07-13 ENCOUNTER — Other Ambulatory Visit: Payer: Self-pay

## 2020-07-13 MED ORDER — SIMVASTATIN 40 MG PO TABS
40.0000 mg | ORAL_TABLET | Freq: Every day | ORAL | 1 refills | Status: DC
Start: 2020-07-13 — End: 2020-11-06

## 2020-07-13 NOTE — Telephone Encounter (Signed)
Pt rx refill sent to pharmacy

## 2020-07-13 NOTE — Telephone Encounter (Signed)
Refill Simvastatin 40 MG

## 2020-07-20 ENCOUNTER — Encounter: Payer: Self-pay | Admitting: Family Medicine

## 2020-07-20 ENCOUNTER — Ambulatory Visit (INDEPENDENT_AMBULATORY_CARE_PROVIDER_SITE_OTHER): Payer: Medicare Other | Admitting: Family Medicine

## 2020-07-20 ENCOUNTER — Other Ambulatory Visit: Payer: Self-pay

## 2020-07-20 VITALS — BP 110/64 | HR 72 | Temp 98.2°F | Resp 14 | Ht 63.25 in | Wt 183.0 lb

## 2020-07-20 DIAGNOSIS — M5136 Other intervertebral disc degeneration, lumbar region: Secondary | ICD-10-CM

## 2020-07-20 DIAGNOSIS — I10 Essential (primary) hypertension: Secondary | ICD-10-CM

## 2020-07-20 DIAGNOSIS — Z23 Encounter for immunization: Secondary | ICD-10-CM | POA: Diagnosis not present

## 2020-07-20 DIAGNOSIS — Z716 Tobacco abuse counseling: Secondary | ICD-10-CM

## 2020-07-20 DIAGNOSIS — E6609 Other obesity due to excess calories: Secondary | ICD-10-CM | POA: Diagnosis not present

## 2020-07-20 DIAGNOSIS — R7303 Prediabetes: Secondary | ICD-10-CM

## 2020-07-20 DIAGNOSIS — E782 Mixed hyperlipidemia: Secondary | ICD-10-CM

## 2020-07-20 DIAGNOSIS — Z6832 Body mass index (BMI) 32.0-32.9, adult: Secondary | ICD-10-CM

## 2020-07-20 MED ORDER — HYDROCODONE-ACETAMINOPHEN 5-325 MG PO TABS: 1.0000 | ORAL_TABLET | Freq: Four times a day (QID) | ORAL | 0 refills | Status: DC | PRN

## 2020-07-20 MED ORDER — GABAPENTIN 300 MG PO CAPS
300.0000 mg | ORAL_CAPSULE | Freq: Every day | ORAL | 3 refills | Status: DC
Start: 2020-07-20 — End: 2020-11-06

## 2020-07-20 MED ORDER — CHANTIX STARTING MONTH PAK 0.5 MG X 11 & 1 MG X 42 PO TABS
ORAL_TABLET | ORAL | 0 refills | Status: DC
Start: 2020-07-20 — End: 2021-06-22

## 2020-07-20 MED ORDER — CYCLOBENZAPRINE HCL 10 MG PO TABS: 10.0000 mg | ORAL_TABLET | Freq: Three times a day (TID) | ORAL | 3 refills | Status: DC | PRN

## 2020-07-20 NOTE — Assessment & Plan Note (Signed)
Reduce carbs/sweets to prevent developing DM Check A1C today

## 2020-07-20 NOTE — Assessment & Plan Note (Signed)
She wants to try chantix again, worked in the past

## 2020-07-20 NOTE — Assessment & Plan Note (Signed)
Lipids at goal, no changes

## 2020-07-20 NOTE — Patient Instructions (Addendum)
Start 2000Iu once a day of vitamin D  We will call with lab results F/U end of March for Physical

## 2020-07-20 NOTE — Assessment & Plan Note (Signed)
Continue pain regimen Does not overuse norco, last script given in March of  2021

## 2020-07-20 NOTE — Assessment & Plan Note (Signed)
Controlled no changes to meds  Check metabolic panel

## 2020-07-20 NOTE — Progress Notes (Signed)
   Subjective:    Patient ID: Karen Huynh, female    DOB: 1950-12-08, 69 y.o.   MRN: [id number]  Patient presents for Follow-up (is not fasting)  Pt here to f/u chronic medical problems  HTN- taking HCTZ   Chronic pain- taking gabapentin , pain meds and flexeril as needed, no change in pain, has DDD in spine    Borderline DM- 5.9% was last A1C, due for recheck,s he has been trying to cut back on bread "corn bread" doesn't eat a lot of sweets per report   Due for flu shot    Review Of Systems:  GEN- denies fatigue, fever, weight loss,weakness, recent illness HEENT- denies eye drainage, change in vision, nasal discharge, CVS- denies chest pain, palpitations RESP- denies SOB, cough, wheeze ABD- denies N/V, change in stools, abd pain GU- denies dysuria, hematuria, dribbling, incontinence MSK- +joint pain, muscle aches, injury Neuro- denies headache, dizziness, syncope, seizure activity       Objective:    BP 110/64   Pulse 72   Temp 98.2 F (36.8 C) (Temporal)   Resp 14   Ht 5' 3.25" (1.607 m)   Wt 183 lb (83 kg)   SpO2 96%   BMI 32.16 kg/m  GEN- NAD, alert and oriented x3 HEENT- PERRL, EOMI, non injected sclera, pink conjunctiva, MMM, oropharynx clear Neck- Supple, no thyromegaly CVS- RRR, no murmur RESP-CTAB ABD-NABS,soft,NT,ND EXT- No edema Pulses- Radial, DP- 2+        Assessment & Plan:      Problem List Items Addressed This Visit      Unprioritized   Borderline diabetes mellitus    Reduce carbs/sweets to prevent developing DM Check A1C today       Relevant Orders   Hemoglobin A1c   DDD (degenerative disc disease), lumbar    Continue pain regimen Does not overuse norco, last script given in March of  2021       Relevant Medications   HYDROcodone-acetaminophen (NORCO/VICODIN) 5-325 MG tablet   cyclobenzaprine (FLEXERIL) 10 MG tablet   Encounter for smoking cessation counseling    She wants to try chantix again, worked in the past        Hyperlipidemia    Lipids at goal, no changes      Hypertension    Controlled no changes to meds  Check metabolic panel       Relevant Orders   Comprehensive metabolic panel   Obesity    Other Visit Diagnoses    Need for immunization against influenza    -  Primary   Relevant Orders   Flu Vaccine QUAD High Dose(Fluad) (Completed)      Note: This dictation was prepared with Dragon dictation along with smaller phrase technology. Any transcriptional errors that result from this process are unintentional.

## 2020-07-21 LAB — HEMOGLOBIN A1C
Hgb A1c MFr Bld: 5.9 % of total Hgb — ABNORMAL HIGH (ref <5.7)
Mean Plasma Glucose: 123 (calc)
eAG (mmol/L): 6.8 (calc)

## 2020-07-21 LAB — COMPREHENSIVE METABOLIC PANEL
AG Ratio: 2 (calc) (ref 1.0–2.5)
ALT: 17 U/L (ref 6–29)
AST: 20 U/L (ref 10–35)
Albumin: 4.4 g/dL (ref 3.6–5.1)
Alkaline phosphatase (APISO): 57 U/L (ref 37–153)
BUN: 11 mg/dL (ref 7–25)
CO2: 28 mmol/L (ref 20–32)
Calcium: 10.2 mg/dL (ref 8.6–10.4)
Chloride: 104 mmol/L (ref 98–110)
Creat: 0.73 mg/dL (ref 0.50–0.99)
Globulin: 2.2 g/dL (calc) (ref 1.9–3.7)
Glucose, Bld: 90 mg/dL (ref 65–99)
Potassium: 4 mmol/L (ref 3.5–5.3)
Sodium: 140 mmol/L (ref 135–146)
Total Bilirubin: 0.4 mg/dL (ref 0.2–1.2)
Total Protein: 6.6 g/dL (ref 6.1–8.1)

## 2020-09-08 ENCOUNTER — Other Ambulatory Visit (HOSPITAL_COMMUNITY): Payer: Self-pay | Admitting: Family Medicine

## 2020-09-08 DIAGNOSIS — Z1231 Encounter for screening mammogram for malignant neoplasm of breast: Secondary | ICD-10-CM

## 2020-09-21 ENCOUNTER — Ambulatory Visit (HOSPITAL_COMMUNITY)
Admission: RE | Admit: 2020-09-21 | Discharge: 2020-09-21 | Disposition: A | Discharge status: Home / Self Care | Payer: Medicare HMO | Source: Ambulatory Visit | Attending: Family Medicine | Admitting: Family Medicine

## 2020-09-21 ENCOUNTER — Other Ambulatory Visit: Payer: Self-pay

## 2020-09-21 DIAGNOSIS — Z1231 Encounter for screening mammogram for malignant neoplasm of breast: Secondary | ICD-10-CM | POA: Diagnosis not present

## 2020-09-23 ENCOUNTER — Encounter: Payer: Medicare Other | Admitting: Family Medicine

## 2020-11-05 ENCOUNTER — Telehealth: Payer: Self-pay | Admitting: Family Medicine

## 2020-11-05 DIAGNOSIS — M5136 Other intervertebral disc degeneration, lumbar region: Secondary | ICD-10-CM

## 2020-11-05 DIAGNOSIS — I10 Essential (primary) hypertension: Secondary | ICD-10-CM

## 2020-11-05 NOTE — Telephone Encounter (Signed)
Pt came in office asking if Dr. Buelah Manis could send in refills for all her meds until she finds a PCP. She is almost out of her BP meds.   Cb#: [phone]

## 2020-11-06 MED ORDER — HYDROCODONE-ACETAMINOPHEN 5-325 MG PO TABS: 1.0000 | ORAL_TABLET | Freq: Four times a day (QID) | ORAL | 0 refills | Status: DC | PRN

## 2020-11-06 MED ORDER — CYCLOBENZAPRINE HCL 10 MG PO TABS: 10.0000 mg | ORAL_TABLET | Freq: Three times a day (TID) | ORAL | 3 refills | Status: AC | PRN

## 2020-11-06 MED ORDER — GABAPENTIN 300 MG PO CAPS: 300.0000 mg | ORAL_CAPSULE | Freq: Every day | ORAL | 1 refills | Status: AC

## 2020-11-06 MED ORDER — SIMVASTATIN 40 MG PO TABS: 40.0000 mg | ORAL_TABLET | Freq: Every day | ORAL | 1 refills | Status: AC

## 2020-11-06 MED ORDER — HYDROCHLOROTHIAZIDE 25 MG PO TABS: 25.0000 mg | ORAL_TABLET | Freq: Every day | ORAL | 1 refills | Status: AC

## 2020-11-06 NOTE — Telephone Encounter (Signed)
Medications refilled

## 2020-11-21 DIAGNOSIS — Z823 Family history of stroke: Secondary | ICD-10-CM | POA: Diagnosis not present

## 2020-11-21 DIAGNOSIS — G8929 Other chronic pain: Secondary | ICD-10-CM | POA: Diagnosis not present

## 2020-11-21 DIAGNOSIS — Z809 Family history of malignant neoplasm, unspecified: Secondary | ICD-10-CM | POA: Diagnosis not present

## 2020-11-21 DIAGNOSIS — E669 Obesity, unspecified: Secondary | ICD-10-CM | POA: Diagnosis not present

## 2020-11-21 DIAGNOSIS — I10 Essential (primary) hypertension: Secondary | ICD-10-CM | POA: Diagnosis not present

## 2020-11-21 DIAGNOSIS — M1990 Unspecified osteoarthritis, unspecified site: Secondary | ICD-10-CM | POA: Diagnosis not present

## 2020-11-21 DIAGNOSIS — R69 Illness, unspecified: Secondary | ICD-10-CM | POA: Diagnosis not present

## 2020-11-21 DIAGNOSIS — E785 Hyperlipidemia, unspecified: Secondary | ICD-10-CM | POA: Diagnosis not present

## 2020-11-21 DIAGNOSIS — Z79891 Long term (current) use of opiate analgesic: Secondary | ICD-10-CM | POA: Diagnosis not present

## 2020-11-21 DIAGNOSIS — H547 Unspecified visual loss: Secondary | ICD-10-CM | POA: Diagnosis not present

## 2020-12-17 ENCOUNTER — Telehealth: Payer: Self-pay

## 2020-12-17 NOTE — Telephone Encounter (Signed)
Per Dr Posey Pronto no for new patient.

## 2020-12-22 ENCOUNTER — Other Ambulatory Visit (INDEPENDENT_AMBULATORY_CARE_PROVIDER_SITE_OTHER): Payer: Self-pay | Admitting: Internal Medicine

## 2020-12-22 DIAGNOSIS — M5136 Other intervertebral disc degeneration, lumbar region: Secondary | ICD-10-CM

## 2021-01-26 DIAGNOSIS — Z0189 Encounter for other specified special examinations: Secondary | ICD-10-CM | POA: Diagnosis not present

## 2021-02-04 DIAGNOSIS — M1990 Unspecified osteoarthritis, unspecified site: Secondary | ICD-10-CM | POA: Diagnosis not present

## 2021-02-04 DIAGNOSIS — I10 Essential (primary) hypertension: Secondary | ICD-10-CM | POA: Diagnosis not present

## 2021-02-04 DIAGNOSIS — R7303 Prediabetes: Secondary | ICD-10-CM | POA: Diagnosis not present

## 2021-02-04 DIAGNOSIS — E559 Vitamin D deficiency, unspecified: Secondary | ICD-10-CM | POA: Diagnosis not present

## 2021-02-09 DIAGNOSIS — E559 Vitamin D deficiency, unspecified: Secondary | ICD-10-CM | POA: Diagnosis not present

## 2021-02-09 DIAGNOSIS — I10 Essential (primary) hypertension: Secondary | ICD-10-CM | POA: Diagnosis not present

## 2021-02-09 DIAGNOSIS — E785 Hyperlipidemia, unspecified: Secondary | ICD-10-CM | POA: Diagnosis not present

## 2021-02-09 DIAGNOSIS — Z1211 Encounter for screening for malignant neoplasm of colon: Secondary | ICD-10-CM | POA: Diagnosis not present

## 2021-02-09 DIAGNOSIS — R7303 Prediabetes: Secondary | ICD-10-CM | POA: Diagnosis not present

## 2021-02-22 ENCOUNTER — Encounter: Payer: Self-pay | Admitting: Internal Medicine

## 2021-04-21 ENCOUNTER — Ambulatory Visit: Payer: Medicare HMO | Admitting: Internal Medicine

## 2021-06-22 ENCOUNTER — Encounter: Payer: Self-pay | Admitting: Emergency Medicine

## 2021-06-22 ENCOUNTER — Ambulatory Visit (INDEPENDENT_AMBULATORY_CARE_PROVIDER_SITE_OTHER): Payer: Medicare HMO

## 2021-06-22 ENCOUNTER — Other Ambulatory Visit: Payer: Self-pay

## 2021-06-22 ENCOUNTER — Ambulatory Visit
Admission: EM | Admit: 2021-06-22 | Discharge: 2021-06-22 | Disposition: A | Discharge status: Home / Self Care | Payer: Medicare HMO | Attending: Emergency Medicine | Admitting: Emergency Medicine

## 2021-06-22 DIAGNOSIS — Z20822 Contact with and (suspected) exposure to covid-19: Secondary | ICD-10-CM

## 2021-06-22 DIAGNOSIS — R059 Cough, unspecified: Secondary | ICD-10-CM

## 2021-06-22 DIAGNOSIS — R062 Wheezing: Secondary | ICD-10-CM

## 2021-06-22 DIAGNOSIS — J988 Other specified respiratory disorders: Secondary | ICD-10-CM

## 2021-06-22 MED ORDER — ALBUTEROL SULFATE HFA 108 (90 BASE) MCG/ACT IN AERS: 1.0000 | INHALATION_SPRAY | RESPIRATORY_TRACT | 0 refills | Status: DC | PRN

## 2021-06-22 MED ORDER — IBUPROFEN 600 MG PO TABS: 600.0000 mg | ORAL_TABLET | Freq: Four times a day (QID) | ORAL | 0 refills | Status: AC | PRN

## 2021-06-22 MED ORDER — AEROCHAMBER PLUS MISC: 2 refills | Status: DC

## 2021-06-22 MED ORDER — FLUTICASONE PROPIONATE 50 MCG/ACT NA SUSP: 2.0000 | Freq: Every day | NASAL | 0 refills | Status: DC

## 2021-06-22 NOTE — Discharge Instructions (Addendum)
Your chest x-ray was negative for pneumonia.  Try 2 puffs from your albuterol inhaler every 4-6 hours as needed for coughing, wheezing.  Flonase, saline nasal irrigation and Mucinex for the nasal congestion.  Take 600 mg of ibuprofen combined with 1000 mg of Tylenol together 3-4 times a day as needed for headache.  Return here or see your doctor in 3 days if not getting any better or if you get worse and we can consider antibiotics at that time.  Your COVID will be back in 1 to 2 days.  If it comes back positive, I will prescribe Molnupiravir.

## 2021-06-22 NOTE — ED Provider Notes (Signed)
HPI  SUBJECTIVE:  Karen Huynh is a 70 y.o. female who presents with 2 days of a headache, nasal congestion, sinus pain and pressure, clear rhinorrhea, cough productive of white-yellow sputum, wheezing.  She had body aches yesterday, but these have resolved.  No fevers, facial swelling, upper dental pain, loss of sense of smell or taste, chest pain, shortness of breath, nausea, vomiting, diarrhea, abdominal pain.  No known flu or COVID exposure.  She had this years flu shot and the COVID booster.  No antibiotics in the past month.  No Antipyretic in the past 6 hours.  She states that she is able to sleep at night without waking up coughing.  She is a [employer] manager.  She has been taking Tylenol 500 mg twice daily, DayQuil and NyQuil without improvement in her symptoms.  No aggravating factors.  She has a past medical history of hypertension, hypercholesterolemia.  No history of diabetes, pulmonary disease, smoking.  HUT:MLYY, Edwinna Areola, MD   Past Medical History:  Diagnosis Date   Bronchitis    Constipation    H/O: rheumatic fever    as child   Heart murmur    High cholesterol    History of pneumonia    Leukopenia    Leukopenia 06/17/2013   Muscle cramps 08/12/2013   OA (osteoarthritis of spine)    Thoracic spine pain 09/03/2014    Past Surgical History:  Procedure Laterality Date   COLONOSCOPY N/A 09/11/2015   Procedure: COLONOSCOPY;  Surgeon: Danie Binder, MD;  [facility];  Service: Endoscopy;  Laterality: N/A;  1030 - moved to 12:00 - pt knows to arrive at 11:00   COLONOSCOPY N/A 06/14/2019   Procedure: COLONOSCOPY;  Surgeon: Danie Binder, MD;  [facility];  Service: Endoscopy;  Laterality: N/A;  11:15   COLONOSCOPY W/ BIOPSIES AND POLYPECTOMY  SEP 2010    SIMPLE ADENOMA(1)   POLYPECTOMY  06/14/2019   Procedure: POLYPECTOMY;  Surgeon: Danie Binder, MD;  [facility];  Service: Endoscopy;;  sigmoid, descending colon    Family History  Problem Relation  Age of Onset   Colon cancer Mother        age > 69   Cancer Mother    Aneurysm Father    Cancer Maternal Aunt    Cancer Other        niece with breast cancer   Cancer Brother        prostate   Colon polyps Brother        age > 56, surgical resection   Kidney failure Maternal Grandmother    Dementia Maternal Grandfather    Congestive Heart Failure Sister    COPD Sister    Hypertension Sister    Asthma Brother    Dementia Maternal Uncle    Dementia Maternal Aunt    Other Son        MVA   Cancer Sister     Social History   Tobacco Use   Smoking status: Former    Packs/day: 0.00    Years: 47.00    Pack years: 0.00    Types: Cigarettes    Quit date: 11/12/2019    Years since quitting: 1.6   Smokeless tobacco: Never  Vaping Use   Vaping Use: Never used  Substance Use Topics   Alcohol use: No   Drug use: No    No current facility-administered medications for this encounter.  Current Outpatient Medications:    albuterol (VENTOLIN  HFA) 108 (90 Base) MCG/ACT inhaler, Inhale 1-2 puffs into the lungs every 4 (four) hours as needed for wheezing or shortness of breath., Disp: 1 each, Rfl: 0   fluticasone (FLONASE) 50 MCG/ACT nasal spray, Place 2 sprays into both nostrils daily., Disp: 16 g, Rfl: 0   ibuprofen (ADVIL) 600 MG tablet, Take 1 tablet (600 mg total) by mouth every 6 (six) hours as needed for up to 5 days., Disp: 20 tablet, Rfl: 0   Spacer/Aero-Holding Chambers (AEROCHAMBER PLUS) inhaler, Use with inhaler, Disp: 1 each, Rfl: 2   Ascorbic Acid (VITAMIN C) 1000 MG tablet, Take 1,000 mg by mouth daily., Disp: , Rfl:    cyclobenzaprine (FLEXERIL) 10 MG tablet, Take 1 tablet (10 mg total) by mouth 3 (three) times daily as needed for muscle spasms., Disp: 30 tablet, Rfl: 3   gabapentin (NEURONTIN) 300 MG capsule, Take 1 capsule (300 mg total) by mouth at bedtime. For nerve pain, Disp: 90 capsule, Rfl: 1   hydrochlorothiazide (HYDRODIURIL) 25 MG tablet, Take 1 tablet (25 mg  total) by mouth daily., Disp: 90 tablet, Rfl: 1   saccharomyces boulardii (FLORASTOR) 250 MG capsule, Take 250 mg by mouth daily. , Disp: , Rfl:    simvastatin (ZOCOR) 40 MG tablet, Take 1 tablet (40 mg total) by mouth daily., Disp: 90 tablet, Rfl: 1   Vitamin D, Ergocalciferol, (DRISDOL) 1.25 MG (50000 UNIT) CAPS capsule, Take 1 capsule (50,000 Units total) by mouth every 7 (seven) days. Start date: 11/21/2019- x6 months tx, then D/C., Disp: 12 capsule, Rfl: 0  Allergies  Allergen Reactions   Penicillins     Gives pt yeast infection. Has patient had a PCN reaction causing immediate rash, facial/tongue/throat swelling, SOB or lightheadedness with hypotension: No Has patient had a PCN reaction causing severe rash involving mucus membranes or skin necrosis: No Has patient had a PCN reaction that required hospitalization No Has patient had a PCN reaction occurring within the last 10 years: No If all of the above answers are "NO", then may proceed with Cephalosporin use.      ROS  As noted in HPI.   Physical Exam  BP (!) 158/86 (BP Location: Right Arm)   Pulse 73   Temp 97.7 F (36.5 C) (Oral)   Resp 18   SpO2 95%   Constitutional: Well developed, well nourished, no acute distress Eyes:  EOMI, conjunctiva normal bilaterally HENT: Normocephalic, atraumatic,mucus membranes moist.  Mild clear nasal congestion.  Normal turbinates.  No maxillary, frontal sinus tenderness.  Normal oropharynx, normal tonsils, no postnasal drip. Respiratory: Normal inspiratory effort.  Wheezing, ronchi right upper lobe. Cardiovascular: Normal rate, regular rhythm, no murmurs rubs or gallop GI: nondistended skin: No rash, skin intact Musculoskeletal: no deformities Neurologic: Alert & oriented x 3, no focal neuro deficits Psychiatric: Speech and behavior appropriate   ED Course   Medications - No data to display  Orders Placed This Encounter  Procedures   Covid-19, Flu A+B ([facility])    Standing  Status:   Standing    Number of Occurrences:   1   DG Chest 2 View    Standing Status:   Standing    Number of Occurrences:   1    Order Specific Question:   Reason for Exam (SYMPTOM  OR DIAGNOSIS REQUIRED)    Answer:   wheezing, cough    No results found for this or any previous visit (from the past 24 hour(s)). DG Chest 2 View  Result Date: 06/22/2021 CLINICAL  DATA:  Wheezing, cough EXAM: CHEST - 2 VIEW COMPARISON:  09/18/2017 FINDINGS: The heart size and mediastinal contours are within normal limits. Both lungs are clear. The visualized skeletal structures are unremarkable. IMPRESSION: Negative. Electronically Signed   By: Rolm Baptise M.D.   On: 06/22/2021 19:10    ED Clinical Impression  1. Respiratory tract infection   2. Exposure to COVID-19 virus   3. Encounter for laboratory testing for COVID-19 virus      ED Assessment/Plan   Reviewed imaging independently.  Normal chest x-ray.   See radiology report for full details.  Patient with an upper respiratory infection.  COVID sent.   She will be a candidate for Molnupiravir if COVID is positive based on age, BMI above 30 and hypertension.  Will prescribe Tamiflu if flu comes back in time.  In the meantime,, albuterol inhaler with a spacer, saline nasal irrigation, Flonase, Mucinex, Tylenol/ibuprofen.   COVID, flu pending at the time of signing of this note.  Discussed labs, imaging, MDM, treatment plan, and plan for follow-up with patient. Discussed sn/sx that should prompt return to the ED. patient agrees with plan.   Meds ordered this encounter  Medications   fluticasone (FLONASE) 50 MCG/ACT nasal spray    Sig: Place 2 sprays into both nostrils daily.    Dispense:  16 g    Refill:  0   albuterol (VENTOLIN HFA) 108 (90 Base) MCG/ACT inhaler    Sig: Inhale 1-2 puffs into the lungs every 4 (four) hours as needed for wheezing or shortness of breath.    Dispense:  1 each    Refill:  0   Spacer/Aero-Holding Chambers  (AEROCHAMBER PLUS) inhaler    Sig: Use with inhaler    Dispense:  1 each    Refill:  2    Please educate patient on use   ibuprofen (ADVIL) 600 MG tablet    Sig: Take 1 tablet (600 mg total) by mouth every 6 (six) hours as needed for up to 5 days.    Dispense:  20 tablet    Refill:  0       *This clinic note was created using [employer]. Therefore, there may be occasional mistakes despite careful proofreading.  ?    Melynda Ripple, MD 06/23/21 [phone]

## 2021-06-22 NOTE — ED Triage Notes (Signed)
Head congestion, productive cough with yellow sputum since Sunday.  States she wheezes at night time.

## 2021-06-23 DIAGNOSIS — J069 Acute upper respiratory infection, unspecified: Secondary | ICD-10-CM | POA: Diagnosis not present

## 2021-06-23 DIAGNOSIS — R059 Cough, unspecified: Secondary | ICD-10-CM | POA: Diagnosis not present

## 2021-06-23 LAB — COVID-19, FLU A+B NAA
Influenza A, NAA: NOT DETECTED
Influenza B, NAA: NOT DETECTED
SARS-CoV-2, NAA: NOT DETECTED

## 2021-07-07 ENCOUNTER — Ambulatory Visit (INDEPENDENT_AMBULATORY_CARE_PROVIDER_SITE_OTHER): Payer: Self-pay | Admitting: Gastroenterology

## 2021-07-07 ENCOUNTER — Other Ambulatory Visit: Payer: Self-pay

## 2021-07-07 ENCOUNTER — Telehealth: Payer: Self-pay | Admitting: Gastroenterology

## 2021-07-07 ENCOUNTER — Encounter: Payer: Self-pay | Admitting: Gastroenterology

## 2021-07-07 VITALS — BP 120/73 | HR 75 | Temp 96.9°F | Ht 63.0 in | Wt 177.6 lb

## 2021-07-07 DIAGNOSIS — Z1211 Encounter for screening for malignant neoplasm of colon: Secondary | ICD-10-CM

## 2021-07-07 DIAGNOSIS — Z1212 Encounter for screening for malignant neoplasm of rectum: Secondary | ICD-10-CM

## 2021-07-07 NOTE — Telephone Encounter (Signed)
Letter was faxed to [facility] NP.

## 2021-07-07 NOTE — Telephone Encounter (Signed)
Patient presented to schedule a colonoscopy at the request of her PCP. Patient had colonoscopy 05/2019 and had two tubular adenomas removed. FH colon cancer in mother and brother. She is due for surveillance exam in 05/2024 based on guidelines. She is NIC'd for colonoscopy at that time.   Patient has no GI issues specifically no melena, brbpr, change in bowels, weight loss, abdominal pain.   Ov was cancelled today. Patient will call if any changes, otherwise we will plan for colonoscopy in 05/2024.   Tammy, please send letter to [facility] NP. I routed letter to you.

## 2021-07-07 NOTE — Progress Notes (Signed)
Presented to schedule colonoscopy per PCP request.   Patient's last colonoscopy was in October 2020, she had 2 simple adenomas removed and 1 hyperplastic polyp.  She is due next colonoscopy in 05/2024. Patient aware. She denies GI symptoms or concerns. Letter sent to her PCP. OV cancelled.

## 2021-08-06 DIAGNOSIS — I10 Essential (primary) hypertension: Secondary | ICD-10-CM | POA: Diagnosis not present

## 2021-08-06 DIAGNOSIS — R7303 Prediabetes: Secondary | ICD-10-CM | POA: Diagnosis not present

## 2021-08-11 DIAGNOSIS — E785 Hyperlipidemia, unspecified: Secondary | ICD-10-CM | POA: Diagnosis not present

## 2021-08-11 DIAGNOSIS — Z23 Encounter for immunization: Secondary | ICD-10-CM | POA: Diagnosis not present

## 2021-08-11 DIAGNOSIS — M545 Low back pain, unspecified: Secondary | ICD-10-CM | POA: Diagnosis not present

## 2021-08-11 DIAGNOSIS — I10 Essential (primary) hypertension: Secondary | ICD-10-CM | POA: Diagnosis not present

## 2021-08-11 DIAGNOSIS — R7303 Prediabetes: Secondary | ICD-10-CM | POA: Diagnosis not present

## 2021-08-11 DIAGNOSIS — R42 Dizziness and giddiness: Secondary | ICD-10-CM | POA: Diagnosis not present

## 2021-09-10 ENCOUNTER — Other Ambulatory Visit (HOSPITAL_COMMUNITY): Payer: Self-pay | Admitting: Internal Medicine

## 2021-09-10 DIAGNOSIS — Z1231 Encounter for screening mammogram for malignant neoplasm of breast: Secondary | ICD-10-CM

## 2021-09-23 ENCOUNTER — Ambulatory Visit (HOSPITAL_COMMUNITY)
Admission: RE | Admit: 2021-09-23 | Discharge: 2021-09-23 | Disposition: A | Discharge status: Home / Self Care | Payer: Medicare HMO | Source: Ambulatory Visit | Attending: Internal Medicine | Admitting: Internal Medicine

## 2021-09-23 ENCOUNTER — Other Ambulatory Visit: Payer: Self-pay

## 2021-09-23 DIAGNOSIS — Z1231 Encounter for screening mammogram for malignant neoplasm of breast: Secondary | ICD-10-CM | POA: Diagnosis not present

## 2021-10-04 DIAGNOSIS — R059 Cough, unspecified: Secondary | ICD-10-CM | POA: Diagnosis not present

## 2021-10-04 DIAGNOSIS — E785 Hyperlipidemia, unspecified: Secondary | ICD-10-CM | POA: Diagnosis not present

## 2021-10-04 DIAGNOSIS — U071 COVID-19: Secondary | ICD-10-CM | POA: Diagnosis not present

## 2021-10-04 DIAGNOSIS — R0981 Nasal congestion: Secondary | ICD-10-CM | POA: Diagnosis not present

## 2021-12-07 ENCOUNTER — Other Ambulatory Visit: Payer: Medicare HMO | Admitting: Adult Health

## 2022-01-11 ENCOUNTER — Ambulatory Visit (INDEPENDENT_AMBULATORY_CARE_PROVIDER_SITE_OTHER): Payer: Medicare HMO | Admitting: Adult Health

## 2022-01-11 ENCOUNTER — Encounter: Payer: Self-pay | Admitting: Adult Health

## 2022-01-11 ENCOUNTER — Other Ambulatory Visit (HOSPITAL_COMMUNITY)
Admission: RE | Admit: 2022-01-11 | Discharge: 2022-01-11 | Disposition: A | Discharge status: Home / Self Care | Payer: Medicare HMO | Source: Ambulatory Visit | Attending: Adult Health | Admitting: Adult Health

## 2022-01-11 VITALS — BP 125/74 | HR 72 | Ht 66.0 in | Wt 182.0 lb

## 2022-01-11 DIAGNOSIS — Z1151 Encounter for screening for human papillomavirus (HPV): Secondary | ICD-10-CM | POA: Diagnosis not present

## 2022-01-11 DIAGNOSIS — R69 Illness, unspecified: Secondary | ICD-10-CM | POA: Diagnosis not present

## 2022-01-11 DIAGNOSIS — Z01419 Encounter for gynecological examination (general) (routine) without abnormal findings: Secondary | ICD-10-CM | POA: Insufficient documentation

## 2022-01-11 DIAGNOSIS — Z1211 Encounter for screening for malignant neoplasm of colon: Secondary | ICD-10-CM

## 2022-01-11 LAB — HEMOCCULT GUIAC POC 1CARD (OFFICE): Fecal Occult Blood, POC: NEGATIVE

## 2022-01-11 NOTE — Progress Notes (Signed)
Patient ID: Karen Huynh, female   DOB: 04-29-1951, 71 y.o.   MRN: [id number] ?History of Present Illness: ?Karen Huynh is a 71 year old black female,married, PM in for a well woman gyn exam and her last pap. ?She still works PT ?PCP is Dr Nevada Crane. ? ? ?Current Medications, Allergies, Past Medical History, Past Surgical History, Family History and Social History were reviewed in [employer] record.   ? ? ?Review of Systems: ? ?Patient denies any headaches, hearing loss, fatigue, blurred vision, shortness of breath, chest pain, abdominal pain, problems with bowel movements, urination, or intercourse(not active). ?No joint pain or mood swings.  ?Denies any vaginal bleeding  ? ?Physical Exam:BP 125/74 (BP Location: Left Arm, Patient Position: Sitting, Cuff Size: Normal)   Pulse 72   Ht '5\' 6"'$  (1.676 m)   Wt 182 lb (82.6 kg)   BMI 29.38 kg/m?   ?General:  Well developed, well nourished, no acute distress ?Skin:  Warm and dry ?Neck:  Midline trachea, normal thyroid, good ROM, no lymphadenopathy,no carotid bruits heard  ?Lungs; Clear to auscultation bilaterally ?Breast:  No dominant palpable mass, retraction, or nipple discharge ?Cardiovascular: Regular rate and rhythm ?Abdomen:  Soft, non tender, no hepatosplenomegaly ?Pelvic:  External genitalia is normal in appearance, no lesions.  The vagina is pale with loss of rugae Urethra has no lesions or masses. The cervix is smooth, pap with HR HPV genotyping performed.  Uterus is felt to be normal size, shape, and contour.  No adnexal masses or tenderness noted.Bladder is non tender, no masses felt. ?Rectal: Good sphincter tone, no polyps, or hemorrhoids felt.  Hemoccult negative. ?Extremities/musculoskeletal:  No swelling or varicosities noted, no clubbing or cyanosis ?Psych:  No mood changes, alert and cooperative,seems happy ?AA is 0 ?Fall risk is low ? ?  01/11/2022  ?  2:27 PM 11/19/2019  ?  2:51 PM 11/08/2019  ?  8:38 AM  ?Depression screen PHQ 2/9   ?Decreased Interest 0 0 0  ?Down, Depressed, Hopeless 0 0 0  ?PHQ - 2 Score 0 0 0  ?Altered sleeping 0    ?Tired, decreased energy 0    ?Change in appetite 0    ?Feeling bad or failure about yourself  0    ?Trouble concentrating 0    ?Moving slowly or fidgety/restless 0    ?Suicidal thoughts 0    ?PHQ-9 Score 0    ?  ? ?  01/11/2022  ?  2:27 PM  ?GAD 7 : Generalized Anxiety Score  ?Nervous, Anxious, on Edge 0  ?Control/stop worrying 1  ?Worry too much - different things 1  ?Trouble relaxing 0  ?Restless 0  ?Easily annoyed or irritable 0  ?Afraid - awful might happen 0  ?Total GAD 7 Score 2  ? ?  ? Upstream - 01/11/22 1426   ? ?  ? Pregnancy Intention Screening  ? Does the patient want to become pregnant in the next year? N/A   ? Does the patient's partner want to become pregnant in the next year? N/A   ? Would the patient like to discuss contraceptive options today? N/A   ?  ? Contraception Wrap Up  ? Current Method No Method - Other Reason   postmenopausal  ? End Method No Method - Other Reason   ? Contraception Counseling Provided No   ? ?  ?  ? ?  ? Examination chaperoned by Marcelino Scot RN ? ?Impression and Plan: ?1. Encounter for gynecological examination with  Papanicolaou smear of cervix ?Pap sent ?Physical with PCP ?No more paps ?Mammogram yearly ?Colonoscopy 2025 ?Labs with PCP ?Follow up with me prn  ? ?2. Encounter for screening fecal occult blood testing ?Hemoccult negative  ? ? ? ?  ?  ?

## 2022-01-17 LAB — CYTOLOGY - PAP
Adequacy: ABSENT
Comment: NEGATIVE
Diagnosis: NEGATIVE
High risk HPV: NEGATIVE

## 2022-01-25 NOTE — Progress Notes (Signed)
This pap will be the last one performed for Karen Huynh, and that is the reason for performing

## 2022-02-03 DIAGNOSIS — R7301 Impaired fasting glucose: Secondary | ICD-10-CM | POA: Diagnosis not present

## 2022-02-03 DIAGNOSIS — E559 Vitamin D deficiency, unspecified: Secondary | ICD-10-CM | POA: Diagnosis not present

## 2022-02-03 DIAGNOSIS — I10 Essential (primary) hypertension: Secondary | ICD-10-CM | POA: Diagnosis not present

## 2022-02-08 DIAGNOSIS — Z0001 Encounter for general adult medical examination with abnormal findings: Secondary | ICD-10-CM | POA: Diagnosis not present

## 2022-02-08 DIAGNOSIS — M545 Low back pain, unspecified: Secondary | ICD-10-CM | POA: Diagnosis not present

## 2022-02-08 DIAGNOSIS — R42 Dizziness and giddiness: Secondary | ICD-10-CM | POA: Diagnosis not present

## 2022-02-08 DIAGNOSIS — R7303 Prediabetes: Secondary | ICD-10-CM | POA: Diagnosis not present

## 2022-02-08 DIAGNOSIS — E785 Hyperlipidemia, unspecified: Secondary | ICD-10-CM | POA: Diagnosis not present

## 2022-02-08 DIAGNOSIS — Z6829 Body mass index (BMI) 29.0-29.9, adult: Secondary | ICD-10-CM | POA: Diagnosis not present

## 2022-02-08 DIAGNOSIS — I10 Essential (primary) hypertension: Secondary | ICD-10-CM | POA: Diagnosis not present

## 2022-02-08 DIAGNOSIS — E663 Overweight: Secondary | ICD-10-CM | POA: Diagnosis not present

## 2022-05-17 DIAGNOSIS — M791 Myalgia, unspecified site: Secondary | ICD-10-CM | POA: Diagnosis not present

## 2022-05-17 DIAGNOSIS — J029 Acute pharyngitis, unspecified: Secondary | ICD-10-CM | POA: Diagnosis not present

## 2022-05-17 DIAGNOSIS — R059 Cough, unspecified: Secondary | ICD-10-CM | POA: Diagnosis not present

## 2022-06-21 DIAGNOSIS — Z23 Encounter for immunization: Secondary | ICD-10-CM | POA: Diagnosis not present

## 2022-08-03 DIAGNOSIS — R7303 Prediabetes: Secondary | ICD-10-CM | POA: Diagnosis not present

## 2022-08-03 DIAGNOSIS — I10 Essential (primary) hypertension: Secondary | ICD-10-CM | POA: Diagnosis not present

## 2022-08-10 DIAGNOSIS — I10 Essential (primary) hypertension: Secondary | ICD-10-CM | POA: Diagnosis not present

## 2022-08-10 DIAGNOSIS — D72819 Decreased white blood cell count, unspecified: Secondary | ICD-10-CM | POA: Diagnosis not present

## 2022-08-10 DIAGNOSIS — E785 Hyperlipidemia, unspecified: Secondary | ICD-10-CM | POA: Diagnosis not present

## 2022-08-10 DIAGNOSIS — S0120XA Unspecified open wound of nose, initial encounter: Secondary | ICD-10-CM | POA: Diagnosis not present

## 2022-08-10 DIAGNOSIS — R7303 Prediabetes: Secondary | ICD-10-CM | POA: Diagnosis not present

## 2022-08-10 DIAGNOSIS — R42 Dizziness and giddiness: Secondary | ICD-10-CM | POA: Diagnosis not present

## 2022-08-10 DIAGNOSIS — M545 Low back pain, unspecified: Secondary | ICD-10-CM | POA: Diagnosis not present

## 2022-08-31 ENCOUNTER — Other Ambulatory Visit (HOSPITAL_COMMUNITY): Payer: Self-pay | Admitting: Internal Medicine

## 2022-08-31 DIAGNOSIS — Z1231 Encounter for screening mammogram for malignant neoplasm of breast: Secondary | ICD-10-CM

## 2022-09-26 ENCOUNTER — Ambulatory Visit (HOSPITAL_COMMUNITY)
Admission: RE | Admit: 2022-09-26 | Discharge: 2022-09-26 | Disposition: A | Discharge status: Home / Self Care | Payer: Medicare HMO | Source: Ambulatory Visit | Attending: Internal Medicine | Admitting: Internal Medicine

## 2022-09-26 DIAGNOSIS — Z1231 Encounter for screening mammogram for malignant neoplasm of breast: Secondary | ICD-10-CM | POA: Insufficient documentation

## 2022-10-19 DIAGNOSIS — H5203 Hypermetropia, bilateral: Secondary | ICD-10-CM | POA: Diagnosis not present

## 2023-02-07 DIAGNOSIS — I10 Essential (primary) hypertension: Secondary | ICD-10-CM | POA: Diagnosis not present

## 2023-02-07 DIAGNOSIS — R7303 Prediabetes: Secondary | ICD-10-CM | POA: Diagnosis not present

## 2023-02-14 DIAGNOSIS — I10 Essential (primary) hypertension: Secondary | ICD-10-CM | POA: Diagnosis not present

## 2023-02-14 DIAGNOSIS — D126 Benign neoplasm of colon, unspecified: Secondary | ICD-10-CM | POA: Diagnosis not present

## 2023-02-14 DIAGNOSIS — M545 Low back pain, unspecified: Secondary | ICD-10-CM | POA: Diagnosis not present

## 2023-02-14 DIAGNOSIS — E785 Hyperlipidemia, unspecified: Secondary | ICD-10-CM | POA: Diagnosis not present

## 2023-02-14 DIAGNOSIS — D72819 Decreased white blood cell count, unspecified: Secondary | ICD-10-CM | POA: Diagnosis not present

## 2023-02-14 DIAGNOSIS — R42 Dizziness and giddiness: Secondary | ICD-10-CM | POA: Diagnosis not present

## 2023-02-14 DIAGNOSIS — Z0001 Encounter for general adult medical examination with abnormal findings: Secondary | ICD-10-CM | POA: Diagnosis not present

## 2023-02-14 DIAGNOSIS — R7303 Prediabetes: Secondary | ICD-10-CM | POA: Diagnosis not present

## 2023-04-09 DIAGNOSIS — Z823 Family history of stroke: Secondary | ICD-10-CM | POA: Diagnosis not present

## 2023-04-09 DIAGNOSIS — I10 Essential (primary) hypertension: Secondary | ICD-10-CM | POA: Diagnosis not present

## 2023-04-09 DIAGNOSIS — Z809 Family history of malignant neoplasm, unspecified: Secondary | ICD-10-CM | POA: Diagnosis not present

## 2023-04-09 DIAGNOSIS — M62838 Other muscle spasm: Secondary | ICD-10-CM | POA: Diagnosis not present

## 2023-04-09 DIAGNOSIS — G8929 Other chronic pain: Secondary | ICD-10-CM | POA: Diagnosis not present

## 2023-04-09 DIAGNOSIS — Z87891 Personal history of nicotine dependence: Secondary | ICD-10-CM | POA: Diagnosis not present

## 2023-04-09 DIAGNOSIS — E785 Hyperlipidemia, unspecified: Secondary | ICD-10-CM | POA: Diagnosis not present

## 2023-04-09 DIAGNOSIS — G2581 Restless legs syndrome: Secondary | ICD-10-CM | POA: Diagnosis not present

## 2023-04-09 DIAGNOSIS — M545 Low back pain, unspecified: Secondary | ICD-10-CM | POA: Diagnosis not present

## 2023-05-04 DIAGNOSIS — Z79899 Other long term (current) drug therapy: Secondary | ICD-10-CM | POA: Diagnosis not present

## 2023-05-04 DIAGNOSIS — M25562 Pain in left knee: Secondary | ICD-10-CM | POA: Diagnosis not present

## 2023-05-04 DIAGNOSIS — I10 Essential (primary) hypertension: Secondary | ICD-10-CM | POA: Diagnosis not present

## 2023-05-04 DIAGNOSIS — Z713 Dietary counseling and surveillance: Secondary | ICD-10-CM | POA: Diagnosis not present

## 2023-05-04 DIAGNOSIS — Z6829 Body mass index (BMI) 29.0-29.9, adult: Secondary | ICD-10-CM | POA: Diagnosis not present

## 2023-05-04 DIAGNOSIS — F17210 Nicotine dependence, cigarettes, uncomplicated: Secondary | ICD-10-CM | POA: Diagnosis not present

## 2023-05-04 DIAGNOSIS — G8929 Other chronic pain: Secondary | ICD-10-CM | POA: Diagnosis not present

## 2023-05-04 DIAGNOSIS — M545 Low back pain, unspecified: Secondary | ICD-10-CM | POA: Diagnosis not present

## 2023-05-04 DIAGNOSIS — Z23 Encounter for immunization: Secondary | ICD-10-CM | POA: Diagnosis not present

## 2023-05-05 ENCOUNTER — Other Ambulatory Visit (HOSPITAL_COMMUNITY): Payer: Self-pay | Admitting: Internal Medicine

## 2023-05-05 DIAGNOSIS — Z13820 Encounter for screening for osteoporosis: Secondary | ICD-10-CM

## 2023-05-09 ENCOUNTER — Ambulatory Visit (HOSPITAL_COMMUNITY)
Admission: RE | Admit: 2023-05-09 | Discharge: 2023-05-09 | Disposition: A | Discharge status: Home / Self Care | Payer: Medicare HMO | Source: Ambulatory Visit | Attending: Internal Medicine | Admitting: Internal Medicine

## 2023-05-09 DIAGNOSIS — Z780 Asymptomatic menopausal state: Secondary | ICD-10-CM | POA: Insufficient documentation

## 2023-05-09 DIAGNOSIS — M85852 Other specified disorders of bone density and structure, left thigh: Secondary | ICD-10-CM | POA: Diagnosis not present

## 2023-05-09 DIAGNOSIS — Z13820 Encounter for screening for osteoporosis: Secondary | ICD-10-CM | POA: Insufficient documentation

## 2023-08-10 DIAGNOSIS — R7303 Prediabetes: Secondary | ICD-10-CM | POA: Diagnosis not present

## 2023-08-10 DIAGNOSIS — I10 Essential (primary) hypertension: Secondary | ICD-10-CM | POA: Diagnosis not present

## 2023-08-17 DIAGNOSIS — I10 Essential (primary) hypertension: Secondary | ICD-10-CM | POA: Diagnosis not present

## 2023-08-17 DIAGNOSIS — M545 Low back pain, unspecified: Secondary | ICD-10-CM | POA: Diagnosis not present

## 2023-08-17 DIAGNOSIS — E785 Hyperlipidemia, unspecified: Secondary | ICD-10-CM | POA: Diagnosis not present

## 2023-08-17 DIAGNOSIS — E669 Obesity, unspecified: Secondary | ICD-10-CM | POA: Diagnosis not present

## 2023-08-17 DIAGNOSIS — M25562 Pain in left knee: Secondary | ICD-10-CM | POA: Diagnosis not present

## 2023-08-17 DIAGNOSIS — D72819 Decreased white blood cell count, unspecified: Secondary | ICD-10-CM | POA: Diagnosis not present

## 2023-08-17 DIAGNOSIS — J069 Acute upper respiratory infection, unspecified: Secondary | ICD-10-CM | POA: Diagnosis not present

## 2023-08-17 DIAGNOSIS — R7303 Prediabetes: Secondary | ICD-10-CM | POA: Diagnosis not present

## 2023-08-17 DIAGNOSIS — M8580 Other specified disorders of bone density and structure, unspecified site: Secondary | ICD-10-CM | POA: Diagnosis not present

## 2023-08-17 DIAGNOSIS — D126 Benign neoplasm of colon, unspecified: Secondary | ICD-10-CM | POA: Diagnosis not present

## 2023-08-17 DIAGNOSIS — G8929 Other chronic pain: Secondary | ICD-10-CM | POA: Diagnosis not present

## 2023-08-17 DIAGNOSIS — R42 Dizziness and giddiness: Secondary | ICD-10-CM | POA: Diagnosis not present

## 2023-09-15 ENCOUNTER — Other Ambulatory Visit (HOSPITAL_COMMUNITY): Payer: Self-pay | Admitting: Internal Medicine

## 2023-09-15 DIAGNOSIS — Z1231 Encounter for screening mammogram for malignant neoplasm of breast: Secondary | ICD-10-CM

## 2023-09-29 ENCOUNTER — Encounter (HOSPITAL_COMMUNITY): Payer: Self-pay

## 2023-09-29 ENCOUNTER — Ambulatory Visit (HOSPITAL_COMMUNITY)
Admission: RE | Admit: 2023-09-29 | Discharge: 2023-09-29 | Disposition: A | Discharge status: Home / Self Care | Payer: Medicare HMO | Source: Ambulatory Visit | Attending: Internal Medicine | Admitting: Internal Medicine

## 2023-09-29 DIAGNOSIS — Z1231 Encounter for screening mammogram for malignant neoplasm of breast: Secondary | ICD-10-CM | POA: Diagnosis not present

## 2023-10-21 IMAGING — MG MM DIGITAL SCREENING BILAT W/ TOMO AND CAD
8 series · 8 of 24 positions shown · non-contrast
Comparison: Previous exam(s).

CLINICAL DATA: Screening.

EXAM:
DIGITAL SCREENING BILATERAL MAMMOGRAM WITH TOMOSYNTHESIS AND CAD
TECHNIQUE: Bilateral screening digital craniocaudal and mediolateral oblique
mammograms were obtained. Bilateral screening digital breast
tomosynthesis was performed. The images were evaluated with
computer-aided detection.

[R CC synth-2D]
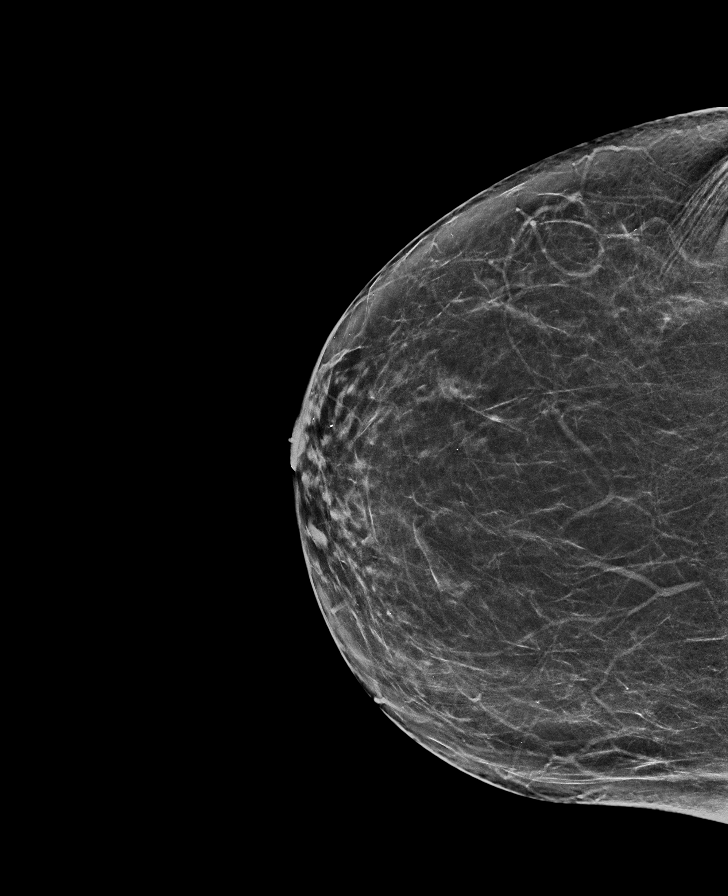

[L CC synth-2D]
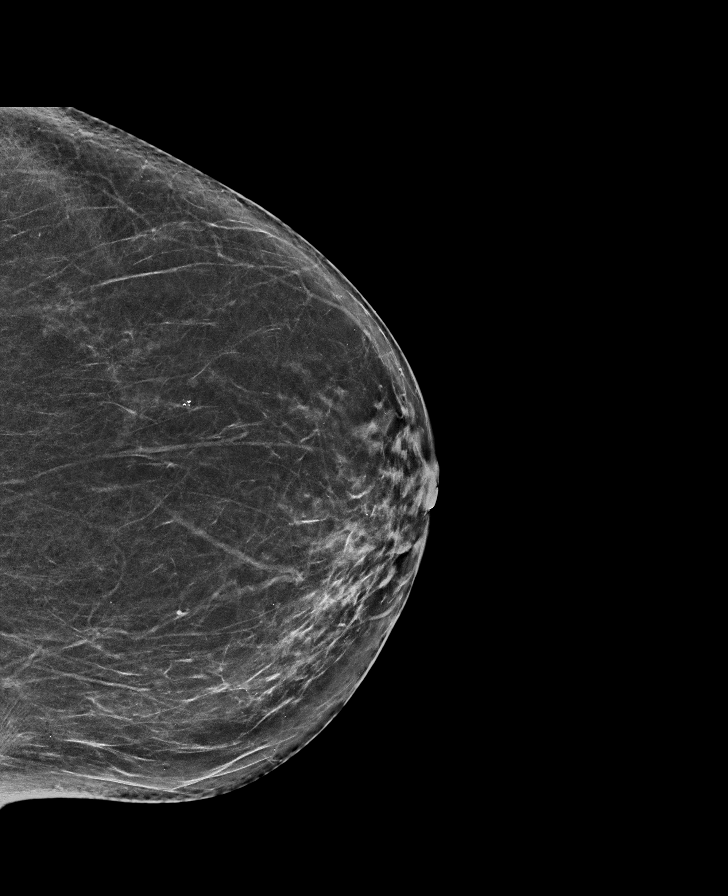

[R MLO synth-2D]
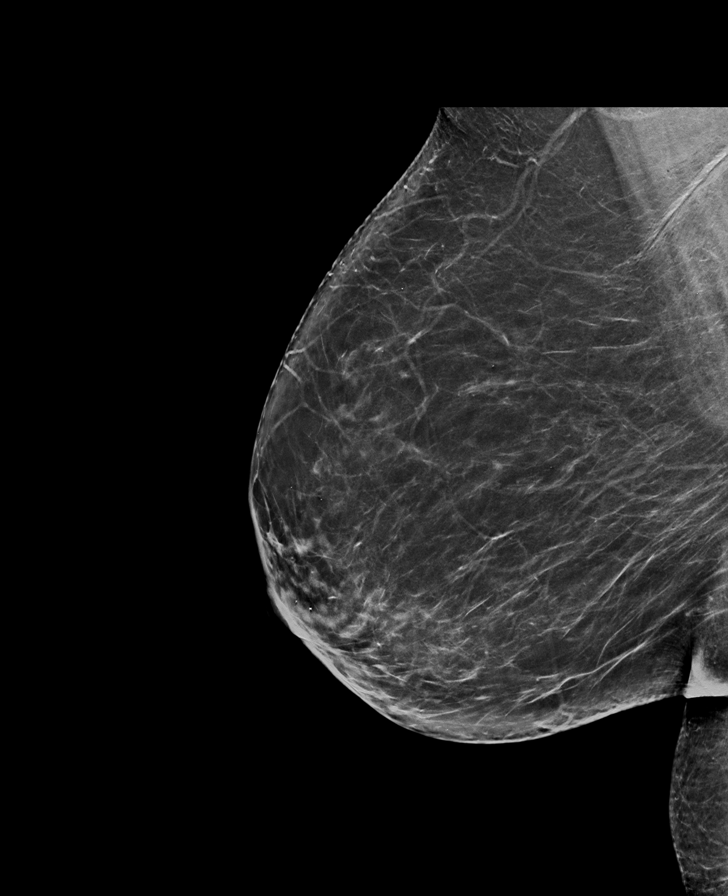

[L MLO synth-2D]
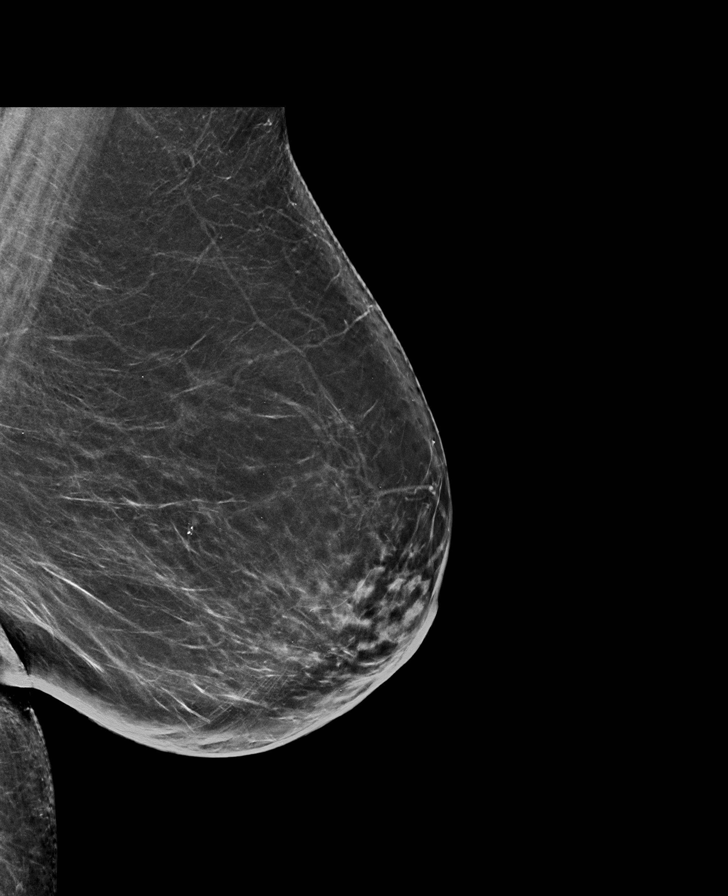

[L MLO tomo · tomo slice 41/80.0]
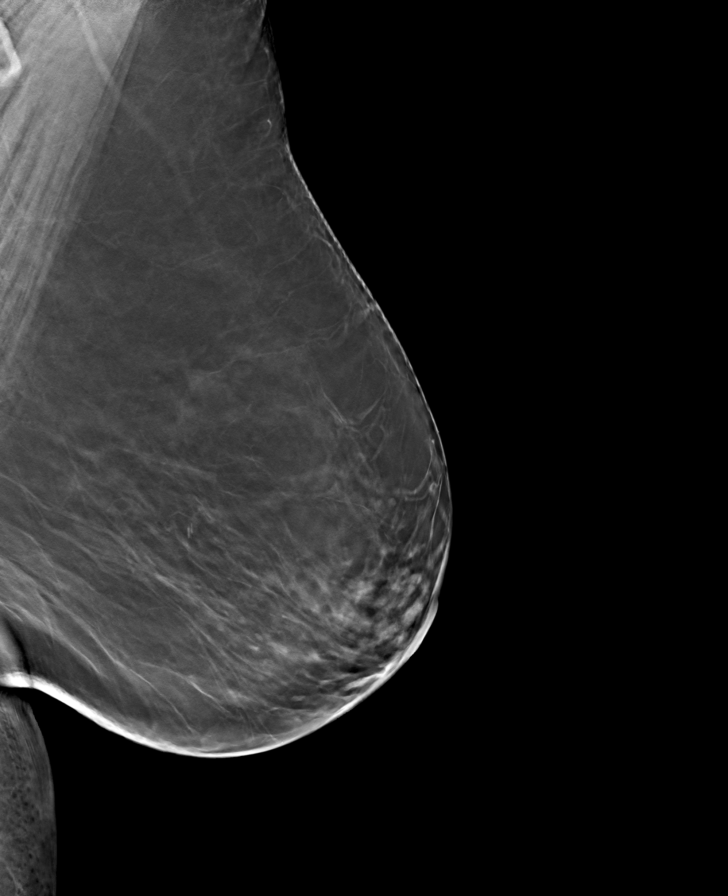

[R CC tomo · tomo slice 35/68.0]
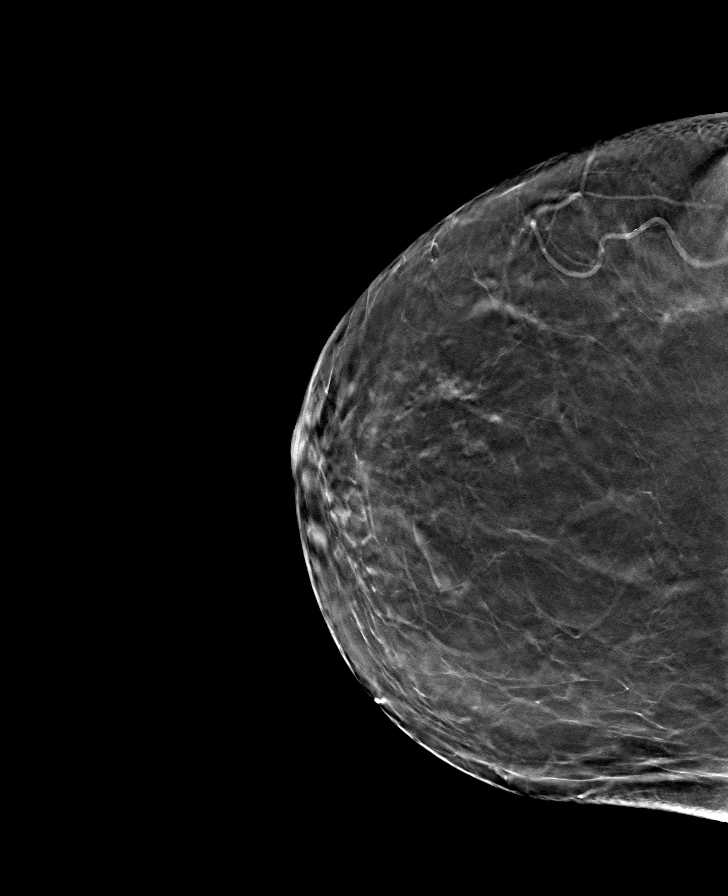

[R MLO tomo · tomo slice 39/76.0]
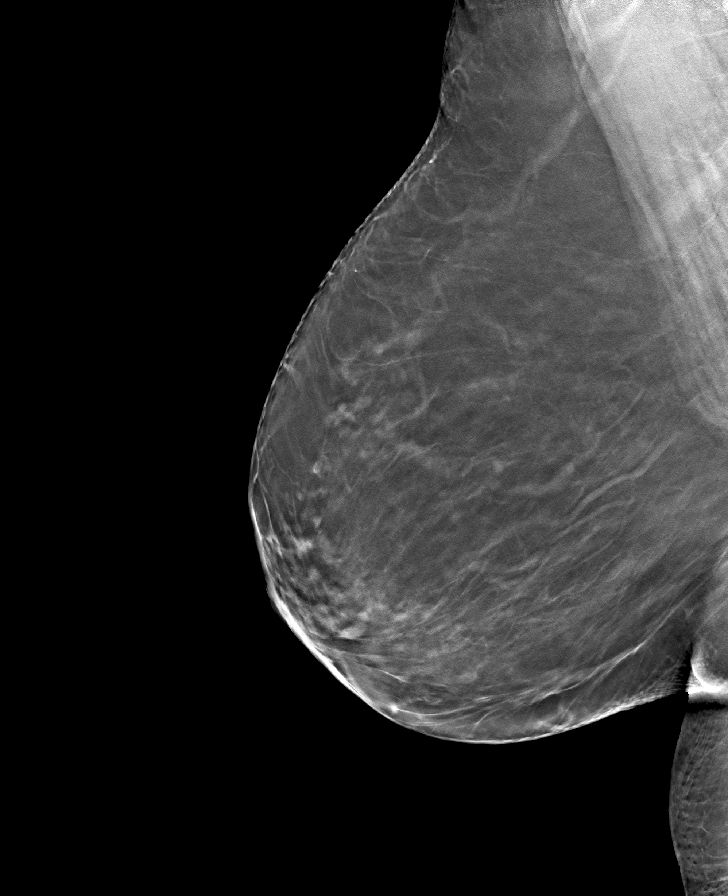

[L CC tomo · tomo slice 37/72.0]
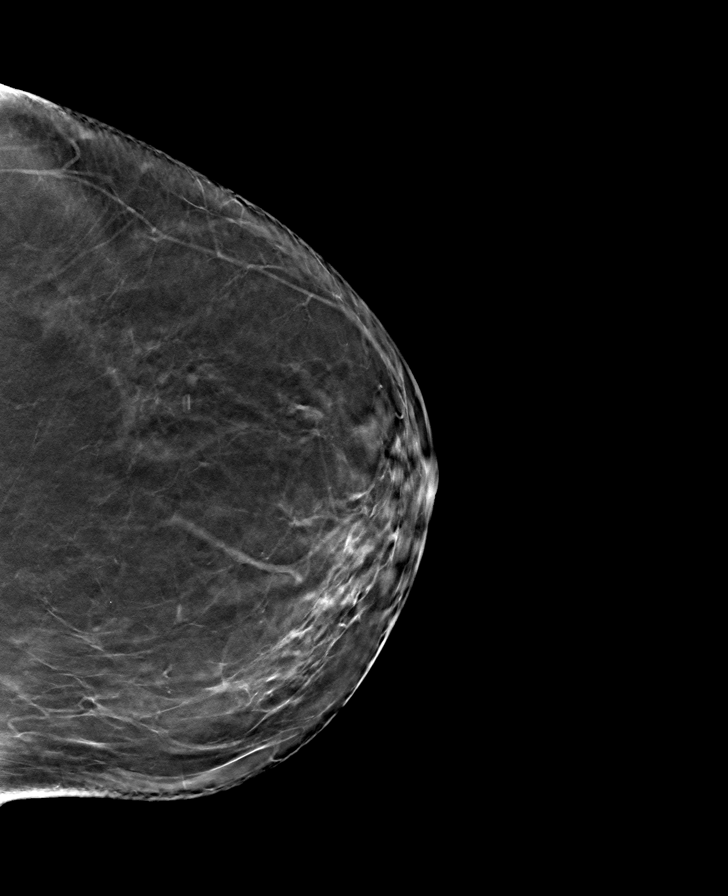

[8 of 24 positions shown; findings below may reference images not displayed]

ACR Breast Density Category b: There are scattered areas of
fibroglandular density.
FINDINGS: There are no findings suspicious for malignancy.
IMPRESSION: No mammographic evidence of malignancy. A result letter of this
screening mammogram will be mailed directly to the patient.

RECOMMENDATION:
Screening mammogram in one year. (Code:51-O-LD2)

BI-RADS CATEGORY  1: Negative.

## 2024-02-16 DIAGNOSIS — E785 Hyperlipidemia, unspecified: Secondary | ICD-10-CM | POA: Diagnosis not present

## 2024-02-16 DIAGNOSIS — D72819 Decreased white blood cell count, unspecified: Secondary | ICD-10-CM | POA: Diagnosis not present

## 2024-02-16 DIAGNOSIS — M25562 Pain in left knee: Secondary | ICD-10-CM | POA: Diagnosis not present

## 2024-02-16 DIAGNOSIS — E669 Obesity, unspecified: Secondary | ICD-10-CM | POA: Diagnosis not present

## 2024-02-16 DIAGNOSIS — M545 Low back pain, unspecified: Secondary | ICD-10-CM | POA: Diagnosis not present

## 2024-02-16 DIAGNOSIS — G8929 Other chronic pain: Secondary | ICD-10-CM | POA: Diagnosis not present

## 2024-02-16 DIAGNOSIS — R202 Paresthesia of skin: Secondary | ICD-10-CM | POA: Diagnosis not present

## 2024-02-16 DIAGNOSIS — Z6829 Body mass index (BMI) 29.0-29.9, adult: Secondary | ICD-10-CM | POA: Diagnosis not present

## 2024-02-16 DIAGNOSIS — I10 Essential (primary) hypertension: Secondary | ICD-10-CM | POA: Diagnosis not present

## 2024-02-16 DIAGNOSIS — D126 Benign neoplasm of colon, unspecified: Secondary | ICD-10-CM | POA: Diagnosis not present

## 2024-02-16 DIAGNOSIS — R7303 Prediabetes: Secondary | ICD-10-CM | POA: Diagnosis not present

## 2024-02-16 DIAGNOSIS — M8580 Other specified disorders of bone density and structure, unspecified site: Secondary | ICD-10-CM | POA: Diagnosis not present

## 2024-03-19 DIAGNOSIS — H52223 Regular astigmatism, bilateral: Secondary | ICD-10-CM | POA: Diagnosis not present

## 2024-03-19 DIAGNOSIS — H5203 Hypermetropia, bilateral: Secondary | ICD-10-CM | POA: Diagnosis not present

## 2024-03-19 DIAGNOSIS — H524 Presbyopia: Secondary | ICD-10-CM | POA: Diagnosis not present

## 2024-05-28 DIAGNOSIS — Z23 Encounter for immunization: Secondary | ICD-10-CM | POA: Diagnosis not present

## 2024-05-30 ENCOUNTER — Encounter (INDEPENDENT_AMBULATORY_CARE_PROVIDER_SITE_OTHER): Payer: Self-pay | Admitting: *Deleted

## 2024-06-04 ENCOUNTER — Encounter (INDEPENDENT_AMBULATORY_CARE_PROVIDER_SITE_OTHER): Payer: Self-pay | Admitting: *Deleted

## 2024-07-03 ENCOUNTER — Telehealth: Payer: Self-pay | Admitting: *Deleted

## 2024-07-03 ENCOUNTER — Telehealth (INDEPENDENT_AMBULATORY_CARE_PROVIDER_SITE_OTHER): Payer: Self-pay | Admitting: Gastroenterology

## 2024-07-03 ENCOUNTER — Ambulatory Visit (INDEPENDENT_AMBULATORY_CARE_PROVIDER_SITE_OTHER): Admitting: Gastroenterology

## 2024-07-03 ENCOUNTER — Encounter: Payer: Self-pay | Admitting: Gastroenterology

## 2024-07-03 VITALS — BP 135/77 | HR 62 | Temp 97.6°F | Ht 66.0 in | Wt 183.6 lb

## 2024-07-03 DIAGNOSIS — K5900 Constipation, unspecified: Secondary | ICD-10-CM | POA: Diagnosis not present

## 2024-07-03 DIAGNOSIS — Z860100 Personal history of colon polyps, unspecified: Secondary | ICD-10-CM

## 2024-07-03 MED ORDER — LINACLOTIDE 145 MCG PO CAPS: 145.0000 ug | ORAL_CAPSULE | Freq: Every day | ORAL | Status: AC

## 2024-07-03 MED ORDER — PEG 3350-KCL-NA BICARB-NACL 420 G PO SOLR: 4000.0000 mL | Freq: Once | ORAL | 0 refills | Status: AC

## 2024-07-03 NOTE — Patient Instructions (Addendum)
 We are arranging a colonoscopy in the near future with Dr Cindie!  We are starting you on Linzess 145 mcg daily. Take this 30 minutes before breakfast. The biggest side effect with this is looser stool, and there is usually a washout period of about 5-7 days while things are getting to normal. If it continues beyond this, let me know, so we can decrease the dosage.  Let me know how the samples work, so we can send in a prescription!  I am also checking your thyroid to make sure this is not abnormal as that can cause constipation as well!  We will see you in 3 months!  It was a pleasure to see you today. I want to create trusting relationships with patients and provide genuine, compassionate, and quality care. I truly value your feedback, so please be on the lookout for a survey regarding your visit with me today. I appreciate your time in completing this!         Therisa MICAEL Stager, PhD, ANP-BC [facility]

## 2024-07-03 NOTE — Addendum Note (Signed)
 Addended by: Fatuma Dowers on: 07/03/2024 09:18 AM   Modules accepted: Orders

## 2024-07-03 NOTE — Progress Notes (Signed)
 Gastroenterology Office Note    Referring Provider: Shona Norleen PEDLAR, MD Primary Care Physician:  Shona Norleen PEDLAR, MD  Primary GI: Dr. Cindie    Chief Complaint   Chief Complaint  Patient presents with   Bowel Changes    Pt arrives due to bowel changes. States she is having constipation. Pt had TCS in 2020. Pt does take Laxative from [facility] and using Miralax. Also states at times she will feel like she has not emptied completely. Used to have Bms daily.      History of Present Illness   Karen Huynh is a 73 y.o. female presenting today at the request of Shona Norleen PEDLAR, MD due to constipation and need for high risk surveillance colonoscopy. She has a personal history of colon polyps, FH colon cancer in mother (age greater than 60),and FH of polyps.    She denies history of constipation but upon review of chart, she has had mild, intermittent constipation for many years, however she states around September started noticing bowel habit changes. Used to have BM every day. Started to notice she was having to strain and feeling full, not emptying well. Takes Miralax and an OTC laxative from [facility] as needed. Still doesn't feel like emptying well. Stool looks smaller in caliber and also has some small balls. No rectal bleeding. No abdominal pain. No loss of appetite or weight loss. No GERD, no dysphagia, no N/V.   No new changes in medications. No increased stress. Drinks lots of water .   Outside labs from June 2025 with hemoglobin 13.8, platelets 181.  Total bilirubin 0.5, alk phos 67, AST 29, ALT 26.  Calcium 9.5.  A1c slightly elevated at 6.1.  She has no recent TSH on file.   Colonoscopy Oct 2020: three 2-4 mm polyps in sigmoid, descending colon. Diverticulosis, hemorrhoids, torturous colon. Path: tubular adenomas and hyperplastic. 3 year surveillance had been recommended.   Colonoscopy 2017: four colon polyps removed, diverticulosis, path: tubular adenomas  Colonoscopy 2010:  adenoma   Mother with hx of colon cancer, diagnosed in her 87s.  FH colon polyps: brother    Past Medical History:  Diagnosis Date   Bronchitis    Constipation    H/O: rheumatic fever    as child   Heart murmur    High cholesterol    History of pneumonia    Leukopenia    Leukopenia 06/17/2013   Muscle cramps 08/12/2013   OA (osteoarthritis of spine)    Thoracic spine pain 09/03/2014    Past Surgical History:  Procedure Laterality Date   COLONOSCOPY N/A 09/11/2015   Procedure: COLONOSCOPY;  Surgeon: Margo LITTIE Haddock, MD;  [facility];  Service: Endoscopy;  Laterality: N/A;  1030 - moved to 12:00 - pt knows to arrive at 11:00   COLONOSCOPY N/A 06/14/2019   Procedure: COLONOSCOPY;  Surgeon: Haddock Margo LITTIE, MD;  Location: [facility];  Service: Endoscopy;  Laterality: N/A;  11:15   COLONOSCOPY W/ BIOPSIES AND POLYPECTOMY  SEP 2010    SIMPLE ADENOMA(1)   POLYPECTOMY  06/14/2019   Procedure: POLYPECTOMY;  Surgeon: Haddock Margo LITTIE, MD;  Location: [facility];  Service: Endoscopy;;  sigmoid, descending colon    Current Outpatient Medications  Medication Sig Dispense Refill   Ascorbic Acid (VITAMIN C) 1000 MG tablet Take 1,000 mg by mouth daily.     Calcium Carbonate (CALCIUM 500 PO) Take by mouth.     celecoxib (CELEBREX) 200 MG capsule Take 200  mg by mouth daily.     Coenzyme Q10 (COQ10 PO) Take by mouth.     cyclobenzaprine  (FLEXERIL ) 10 MG tablet Take 1 tablet (10 mg total) by mouth 3 (three) times daily as needed for muscle spasms. 30 tablet 3   gabapentin  (NEURONTIN ) 300 MG capsule Take 1 capsule (300 mg total) by mouth at bedtime. For nerve pain 90 capsule 1   hydrochlorothiazide  (HYDRODIURIL ) 25 MG tablet Take 1 tablet (25 mg total) by mouth daily. 90 tablet 1   HYDROcodone -acetaminophen  (NORCO/VICODIN) 5-325 MG tablet Take 1 tablet by mouth every 6 (six) hours as needed for moderate pain.     simvastatin  (ZOCOR ) 40 MG tablet Take 1 tablet (40 mg total) by  mouth daily. 90 tablet 1   Turmeric (QC TUMERIC COMPLEX PO) Take by mouth.     VITAMIN D  PO Take by mouth daily.     Vitamin D , Ergocalciferol , (DRISDOL ) 1.25 MG (50000 UNIT) CAPS capsule Take 1 capsule (50,000 Units total) by mouth every 7 (seven) days. Start date: 11/21/2019- x6 months tx, then D/C. 12 capsule 0   No current facility-administered medications for this visit.    Allergies as of 07/03/2024 - Review Complete 07/03/2024  Allergen Reaction Noted   Penicillins  08/12/2013    Family History  Problem Relation Age of Onset   Colon cancer Mother        age > 85   Cancer Mother    Aneurysm Father    Cancer Maternal Aunt    Cancer Other        niece with breast cancer   Cancer Brother        prostate   Colon polyps Brother        age > 13, surgical resection   Kidney failure Maternal Grandmother    Dementia Maternal Grandfather    Congestive Heart Failure Sister    COPD Sister    Hypertension Sister    Asthma Brother    Dementia Maternal Uncle    Dementia Maternal Aunt    Other Son        MVA   Cancer Sister     Social History   Socioeconomic History   Marital status: Married    Spouse name: Not on file   Number of children: Not on file   Years of education: Not on file   Highest education level: Not on file  Occupational History   Not on file  Tobacco Use   Smoking status: Former    Current packs/day: 0.00    Types: Cigarettes    Quit date: 11/11/1972    Years since quitting: 51.6   Smokeless tobacco: Never  Vaping Use   Vaping status: Never Used  Substance and Sexual Activity   Alcohol use: No   Drug use: No   Sexual activity: Not Currently    Birth control/protection: Post-menopausal  Other Topics Concern   Not on file  Social History Narrative   Not on file   Social Drivers of Health   Financial Resource Strain: Low Risk  (01/11/2022)   Overall Financial Resource Strain (CARDIA)    Difficulty of Paying Living Expenses: Not very hard   Food Insecurity: No Food Insecurity (01/11/2022)   Hunger Vital Sign    Worried About Running Out of Food in the Last Year: Never true    Ran Out of Food in the Last Year: Never true  Transportation Needs: No Transportation Needs (01/11/2022)   PRAPARE - Transportation  Lack of Transportation (Medical): No    Lack of Transportation (Non-Medical): No  Physical Activity: Sufficiently Active (01/11/2022)   Exercise Vital Sign    Days of Exercise per Week: 5 days    Minutes of Exercise per Session: 40 min  Stress: No Stress Concern Present (01/11/2022)   [employer] of Occupational Health - Occupational Stress Questionnaire    Feeling of Stress : Not at all  Social Connections: Socially Integrated (01/11/2022)   Social Connection and Isolation Panel    Frequency of Communication with Friends and Family: More than three times a week    Frequency of Social Gatherings with Friends and Family: Twice a week    Attends Religious Services: More than 4 times per year    Active Member of [employer] or Organizations: Yes    Attends Engineer, Structural: More than 4 times per year    Marital Status: Married  Catering Manager Violence: Not At Risk (01/11/2022)   Humiliation, Afraid, Rape, and Kick questionnaire    Fear of Current or Ex-Partner: No    Emotionally Abused: No    Physically Abused: No    Sexually Abused: No     Review of Systems   Gen: Denies any fever, chills, fatigue, weight loss, lack of appetite.  CV: Denies chest pain, heart palpitations, peripheral edema, syncope.  Resp: Denies shortness of breath at rest or with exertion. Denies wheezing or cough.  GI: Denies dysphagia or odynophagia. Denies jaundice, hematemesis, fecal incontinence. GU : Denies urinary burning, urinary frequency, urinary hesitancy MS: Denies joint pain, muscle weakness, cramps, or limitation of movement.  Derm: Denies rash, itching, dry skin Psych: Denies depression, anxiety, memory loss, and  confusion Heme: Denies bruising, bleeding, and enlarged lymph nodes.   Physical Exam   BP 135/77   Pulse 62   Temp 97.6 F (36.4 C)   Ht 5' 6 (1.676 m)   Wt 183 lb 9.6 oz (83.3 kg)   BMI 29.63 kg/m  General:   Alert and oriented. Pleasant and cooperative. Well-nourished and well-developed.  Head:  Normocephalic and atraumatic. Eyes:  Without icterus Ears:  Normal auditory acuity. Lungs:  Clear to auscultation bilaterally.  Heart:  S1, S2 present without murmurs appreciated.  Abdomen:  +BS, soft, non-tender and non-distended. No HSM noted. No guarding or rebound. No masses appreciated.  Rectal: External hemorrhoids without any thrombosis.  DRE without mass.  She does have soft stool in the proximal rectal vault. Msk:  Symmetrical without gross deformities. Normal posture. Extremities:  Without edema. Neurologic:  Alert and  oriented x4;  grossly normal neurologically. Skin:  Intact without significant lesions or rashes. Psych:  Alert and cooperative. Normal mood and affect.   Assessment   Karen Huynh is a delightful 73 y.o. female presenting today  at the request of Shona Norleen PEDLAR, MD due to constipation and need for high risk surveillance colonoscopy. She has a personal history of multiple colon polyps, FH colon cancer in mother (age greater than 60),and FH of polyps.  Her last colonoscopy was actually in 2020, and she is overdue for 3-year surveillance.  Constipation has not been a significant concern but she has had bouts of mild, intermittent constipation over the years.  Now noting onset of worsening constipation around September but without any medication changes or dietary changes.  I do note her labs in June 2025 are unrevealing, however she has not had a recent TSH.  Rectal exam today without any mass, and she does  have soft stool in more proximal rectal vault.  She would prefer to avoid any over-the-counter agents as these are unpredictable and have not been very  efficacious.  We will start her on Linzess 145 mcg daily.  She is also overdue for high risk surveillance colonoscopy in the setting of personal history of adenomas and family history of colon cancer in mother in her 35s and brother with colon polyps.  We will be arranging this in the near future.   PLAN   TSH today  Start Linzess 145 mcg daily.  Samples were provided, and she will let us  know how this works for her and we will send in prescription.  Proceed with colonoscopy by Dr. Cindie  in near future: the risks, benefits, and alternatives have been discussed with the patient in detail. The patient states understanding and desires to proceed.   51-month follow-up.  Therisa MICAEL Stager, PhD, ANP-BC [facility]

## 2024-07-03 NOTE — Telephone Encounter (Signed)
 Medication Samples have been provided to the patient.  Drug name: Linzess       Strength: 145 mcg        Qty: 3 boxes   LOT: [id number]  Exp.Date: 03/26  Dosing instructions: take one capsule po daily  The patient has been instructed regarding the correct time, dose, and frequency of taking this medication, including desired effects and most common side effects.   Glenys Bruns 9:17 AM 07/03/2024

## 2024-07-03 NOTE — Telephone Encounter (Signed)
 Spoke with pt. Scheduled for colonoscopy with Dr. Cindie, ASA 2 on 12/8. Aware will mail instructions and send rx for prep to pharmacy. She is also aware will get a pre-op phone call with arrival time.

## 2024-07-16 DIAGNOSIS — K5900 Constipation, unspecified: Secondary | ICD-10-CM | POA: Diagnosis not present

## 2024-07-17 LAB — TSH: TSH: 1.07 u[IU]/mL (ref 0.450–4.500)

## 2024-07-24 ENCOUNTER — Ambulatory Visit: Payer: Self-pay | Admitting: Gastroenterology

## 2024-07-31 ENCOUNTER — Encounter (HOSPITAL_COMMUNITY): Payer: Self-pay

## 2024-07-31 ENCOUNTER — Encounter (HOSPITAL_COMMUNITY)
Admission: RE | Admit: 2024-07-31 | Discharge: 2024-07-31 | Disposition: A | Source: Ambulatory Visit | Attending: Internal Medicine

## 2024-07-31 ENCOUNTER — Other Ambulatory Visit: Payer: Self-pay

## 2024-08-05 ENCOUNTER — Encounter (HOSPITAL_COMMUNITY): Admission: RE | Disposition: A | Payer: Self-pay | Source: Home / Self Care | Attending: Internal Medicine

## 2024-08-05 ENCOUNTER — Ambulatory Visit (HOSPITAL_COMMUNITY): Admitting: Anesthesiology

## 2024-08-05 ENCOUNTER — Ambulatory Visit (HOSPITAL_COMMUNITY)
Admission: RE | Admit: 2024-08-05 | Discharge: 2024-08-05 | Disposition: A | Attending: Internal Medicine | Admitting: Internal Medicine

## 2024-08-05 ENCOUNTER — Encounter (HOSPITAL_COMMUNITY): Payer: Self-pay | Admitting: Internal Medicine

## 2024-08-05 ENCOUNTER — Other Ambulatory Visit: Payer: Self-pay

## 2024-08-05 DIAGNOSIS — D122 Benign neoplasm of ascending colon: Secondary | ICD-10-CM | POA: Diagnosis not present

## 2024-08-05 DIAGNOSIS — I10 Essential (primary) hypertension: Secondary | ICD-10-CM | POA: Diagnosis not present

## 2024-08-05 DIAGNOSIS — K5730 Diverticulosis of large intestine without perforation or abscess without bleeding: Secondary | ICD-10-CM | POA: Diagnosis not present

## 2024-08-05 DIAGNOSIS — Z1211 Encounter for screening for malignant neoplasm of colon: Secondary | ICD-10-CM | POA: Diagnosis not present

## 2024-08-05 DIAGNOSIS — Z87891 Personal history of nicotine dependence: Secondary | ICD-10-CM | POA: Diagnosis not present

## 2024-08-05 DIAGNOSIS — Z800 Family history of malignant neoplasm of digestive organs: Secondary | ICD-10-CM | POA: Diagnosis not present

## 2024-08-05 DIAGNOSIS — R011 Cardiac murmur, unspecified: Secondary | ICD-10-CM | POA: Diagnosis not present

## 2024-08-05 DIAGNOSIS — K648 Other hemorrhoids: Secondary | ICD-10-CM | POA: Diagnosis not present

## 2024-08-05 DIAGNOSIS — K635 Polyp of colon: Secondary | ICD-10-CM | POA: Diagnosis not present

## 2024-08-05 SURGERY — COLONOSCOPY
Anesthesia: Monitor Anesthesia Care

## 2024-08-05 MED ORDER — PROPOFOL 500 MG/50ML IV EMUL
INTRAVENOUS | Status: DC | PRN
  Administered 2024-08-05: 100 mg via INTRAVENOUS
  Administered 2024-08-05: 150 ug/kg/min via INTRAVENOUS

## 2024-08-05 MED ORDER — LACTATED RINGERS IV SOLN: INTRAVENOUS | Status: DC | PRN

## 2024-08-05 NOTE — Op Note (Signed)
 [facility] Patient Name: Karen Huynh Procedure Date: 08/05/2024 7:18 AM MRN: [id number] Date of Birth: 1951-03-04 Attending MD: Carlin POUR. Cindie , [place], [id number] CSN: [id number] Age: 73 Admit Type: Outpatient Procedure:                Colonoscopy Indications:              Surveillance: Personal history of colonic polyps                            (unknown histology) on last colonoscopy 5 years ago Providers:                Carlin POUR. Cindie, DO, Harlene Lips, Kristine                            L. [place]one Tech, Technician Referring MD:              Medicines:                See the Anesthesia note for documentation of the                            administered medications Complications:            No immediate complications. Estimated Blood Loss:     Estimated blood loss was minimal. Procedure:                Pre-Anesthesia Assessment:                           - The anesthesia plan was to use monitored                            anesthesia care (MAC).                           After obtaining informed consent, the colonoscope                            was passed under direct vision. Throughout the                            procedure, the patient's blood pressure, pulse, and                            oxygen saturations were monitored continuously. The                            [id number] ([id number]) Peds Colon was introduced                            through the anus and advanced to the the cecum,                            identified by appendiceal orifice and ileocecal                            valve. The  colonoscopy was performed without                            difficulty. The patient tolerated the procedure                            well. The quality of the bowel preparation was                            evaluated using the BBPS [facility] Bowel Preparation                            Scale) with scores of: Right Colon = 3, Transverse                             Colon = 3 and Left Colon = 3 (entire mucosa seen                            well with no residual staining, small fragments of                            stool or opaque liquid). The total BBPS score                            equals 9. Scope In: 8:12:19 AM Scope Out: 8:24:54 AM Scope Withdrawal Time: 0 hours 9 minutes 33 seconds  Total Procedure Duration: 0 hours 12 minutes 35 seconds  Findings:      Non-bleeding internal hemorrhoids were found.      Multiple large-mouthed and small-mouthed diverticula were found in the       sigmoid colon and ascending colon.      A 6 mm polyp was found in the ascending colon. The polyp was sessile.       The polyp was removed with a cold snare. Resection and retrieval were       complete.      Three hyperplastic polyps were found in the sigmoid colon. The polyps       were 3 to 5 mm in size. These polyps were removed with a saline       injection-lift technique using a hot snare. Resection and retrieval were       complete.      The exam was otherwise without abnormality. Impression:               - Non-bleeding internal hemorrhoids.                           - Diverticulosis in the sigmoid colon and in the                            ascending colon.                           - One 6 mm polyp in the ascending colon, removed  with a cold snare. Resected and retrieved.                           - Three 3 to 5 mm polyps in the sigmoid colon,                            removed using injection-lift and a hot snare.                            Resected and retrieved.                           - The examination was otherwise normal. Moderate Sedation:      Per Anesthesia Care Recommendation:           - Patient has a contact number available for                            emergencies. The signs and symptoms of potential                            delayed complications were discussed with the                            patient.  Return to normal activities tomorrow.                            Written discharge instructions were provided to the                            patient.                           - Resume previous diet.                           - Continue present medications.                           - Await pathology results.                           - No repeat colonoscopy due to age.                           - Return to GI clinic in 3 months. Procedure Code(s):        --- Professional ---                           [id number], Colonoscopy, flexible; with removal of                            tumor(s), polyp(s), or other lesion(s) by snare                            technique  54618, Colonoscopy, flexible; with directed                            submucosal injection(s), any substance Diagnosis Code(s):        --- Professional ---                           Z86.010, Personal history of colonic polyps                           K64.8, Other hemorrhoids                           D12.2, Benign neoplasm of ascending colon                           D12.5, Benign neoplasm of sigmoid colon                           K57.30, Diverticulosis of large intestine without                            perforation or abscess without bleeding CPT copyright 2022 American Medical Association. All rights reserved. The codes documented in this report are preliminary and upon coder review may  be revised to meet current compliance requirements. Carlin POUR. Cindie, DO Carlin POUR. Cindie, DO 08/05/2024 8:30:47 AM This report has been signed electronically. Number of Addenda: 0

## 2024-08-05 NOTE — Anesthesia Preprocedure Evaluation (Signed)
 Anesthesia Evaluation  Patient identified by MRN, date of birth, ID band Patient awake    Reviewed: Allergy & Precautions, H&P , NPO status , Patient's Chart, lab work & pertinent test results, reviewed documented beta blocker date and time   Airway Mallampati: II  TM Distance: >3 FB Neck ROM: full    Dental no notable dental hx.    Pulmonary neg pulmonary ROS, former smoker   Pulmonary exam normal breath sounds clear to auscultation       Cardiovascular Exercise Tolerance: Good hypertension, + Valvular Problems/Murmurs  Rhythm:regular Rate:Normal     Neuro/Psych negative neurological ROS  negative psych ROS   GI/Hepatic negative GI ROS, Neg liver ROS,,,  Endo/Other  negative endocrine ROS    Renal/GU negative Renal ROS  negative genitourinary   Musculoskeletal   Abdominal   Peds  Hematology negative hematology ROS (+)   Anesthesia Other Findings   Reproductive/Obstetrics negative OB ROS                              Anesthesia Physical Anesthesia Plan  ASA: 3  Anesthesia Plan: MAC   Post-op Pain Management:    Induction:   PONV Risk Score and Plan: Propofol  infusion  Airway Management Planned:   Additional Equipment:   Intra-op Plan:   Post-operative Plan:   Informed Consent: I have reviewed the patients History and Physical, chart, labs and discussed the procedure including the risks, benefits and alternatives for the proposed anesthesia with the patient or authorized representative who has indicated his/her understanding and acceptance.     Dental Advisory Given  Plan Discussed with: CRNA  Anesthesia Plan Comments:         Anesthesia Quick Evaluation

## 2024-08-05 NOTE — H&P (Signed)
 Primary Care Physician:  Shona Norleen PEDLAR, MD Primary Gastroenterologist:  Dr. Cindie  Pre-Procedure History & Physical: HPI:  Karen Huynh is a 73 y.o. female is here for a colonoscopy to be performed for surveillance purposes, personal history of adenomatous colon polyps in 2020  Past Medical History:  Diagnosis Date   Bronchitis    Constipation    H/O: rheumatic fever    as child   Heart murmur    High cholesterol    History of pneumonia    Leukopenia    Leukopenia 06/17/2013   Muscle cramps 08/12/2013   OA (osteoarthritis of spine)    Thoracic spine pain 09/03/2014    Past Surgical History:  Procedure Laterality Date   COLONOSCOPY N/A 09/11/2015   Procedure: COLONOSCOPY;  Surgeon: Margo LITTIE Haddock, MD;  Location: [facility];  Service: Endoscopy;  Laterality: N/A;  1030 - moved to 12:00 - pt knows to arrive at 11:00   COLONOSCOPY N/A 06/14/2019   Procedure: COLONOSCOPY;  Surgeon: Haddock Margo LITTIE, MD;  Location: [facility];  Service: Endoscopy;  Laterality: N/A;  11:15   COLONOSCOPY W/ BIOPSIES AND POLYPECTOMY  SEP 2010    SIMPLE ADENOMA(1)   POLYPECTOMY  06/14/2019   Procedure: POLYPECTOMY;  Surgeon: Haddock Margo LITTIE, MD;  Location: [facility];  Service: Endoscopy;;  sigmoid, descending colon    Prior to Admission medications   Medication Sig Start Date End Date Taking? Authorizing Provider  cyclobenzaprine  (FLEXERIL ) 10 MG tablet Take 1 tablet (10 mg total) by mouth 3 (three) times daily as needed for muscle spasms. 11/06/20  Yes [place], Theodoro FALCON, MD  hydrochlorothiazide  (HYDRODIURIL ) 25 MG tablet Take 1 tablet (25 mg total) by mouth daily. 11/06/20  Yes [place], Theodoro FALCON, MD  HYDROcodone -acetaminophen  (NORCO/VICODIN) 5-325 MG tablet Take 1 tablet by mouth every 6 (six) hours as needed for moderate pain.   Yes [provider]  linaclotide  (LINZESS ) 145 MCG CAPS capsule Take 1 capsule (145 mcg total) by mouth daily before breakfast. 07/03/24  Yes Shirlean Therisa ORN, NP   Ascorbic Acid (VITAMIN C) 1000 MG tablet Take 1,000 mg by mouth daily.    [provider]  Calcium Carbonate (CALCIUM 500 PO) Take by mouth.    [provider]  celecoxib (CELEBREX) 200 MG capsule Take 200 mg by mouth daily. 06/25/24   [provider]  Coenzyme Q10 (COQ10 PO) Take by mouth.    [provider]  gabapentin  (NEURONTIN ) 300 MG capsule Take 1 capsule (300 mg total) by mouth at bedtime. For nerve pain 11/06/20   Bari Theodoro FALCON, MD  simvastatin  (ZOCOR ) 40 MG tablet Take 1 tablet (40 mg total) by mouth daily. 11/06/20   [place], Theodoro FALCON, MD  Turmeric (QC TUMERIC COMPLEX PO) Take by mouth.    [provider]  VITAMIN D  PO Take by mouth daily.    [provider]  Vitamin D , Ergocalciferol , (DRISDOL ) 1.25 MG (50000 UNIT) CAPS capsule Take 1 capsule (50,000 Units total) by mouth every 7 (seven) days. Start date: 11/21/2019- x6 months tx, then D/C. 02/18/20   Bari Theodoro FALCON, MD    Allergies as of 07/03/2024 - Review Complete 07/03/2024  Allergen Reaction Noted   Penicillins  08/12/2013    Family History  Problem Relation Age of Onset   Colon cancer Mother        age > 52   Cancer Mother    Aneurysm Father    Cancer Maternal Aunt  Cancer Other        niece with breast cancer   Cancer Brother        prostate   Colon polyps Brother        age > 19, surgical resection   Kidney failure Maternal Grandmother    Dementia Maternal Grandfather    Congestive Heart Failure Sister    COPD Sister    Hypertension Sister    Asthma Brother    Dementia Maternal Uncle    Dementia Maternal Aunt    Other Son        MVA   Cancer Sister     Social History   Socioeconomic History   Marital status: Married    Spouse name: Not on file   Number of children: Not on file   Years of education: Not on file   Highest education level: Not on file  Occupational History   Not on file  Tobacco Use   Smoking status: Former    Current  packs/day: 0.00    Types: Cigarettes    Quit date: 11/11/1972    Years since quitting: 51.7   Smokeless tobacco: Never  Vaping Use   Vaping status: Never Used  Substance and Sexual Activity   Alcohol use: No   Drug use: No   Sexual activity: Not Currently    Birth control/protection: Post-menopausal  Other Topics Concern   Not on file  Social History Narrative   Not on file   Social Drivers of Health   Financial Resource Strain: Low Risk  (01/11/2022)   Overall Financial Resource Strain (CARDIA)    Difficulty of Paying Living Expenses: Not very hard  Food Insecurity: No Food Insecurity (01/11/2022)   Hunger Vital Sign    Worried About Running Out of Food in the Last Year: Never true    Ran Out of Food in the Last Year: Never true  Transportation Needs: No Transportation Needs (01/11/2022)   PRAPARE - Administrator, Civil Service (Medical): No    Lack of Transportation (Non-Medical): No  Physical Activity: Sufficiently Active (01/11/2022)   Exercise Vital Sign    Days of Exercise per Week: 5 days    Minutes of Exercise per Session: 40 min  Stress: No Stress Concern Present (01/11/2022)   [employer] of Occupational Health - Occupational Stress Questionnaire    Feeling of Stress : Not at all  Social Connections: Socially Integrated (01/11/2022)   Social Connection and Isolation Panel    Frequency of Communication with Friends and Family: More than three times a week    Frequency of Social Gatherings with Friends and Family: Twice a week    Attends Religious Services: More than 4 times per year    Active Member of Clubs or Organizations: Yes    Attends Banker Meetings: More than 4 times per year    Marital Status: Married  Catering Manager Violence: Not At Risk (01/11/2022)   Humiliation, Afraid, Rape, and Kick questionnaire    Fear of Current or Ex-Partner: No    Emotionally Abused: No    Physically Abused: No    Sexually Abused: No    Review  of Systems: See HPI, otherwise negative ROS  Physical Exam: Vital signs in last 24 hours: Temp:  [98.6 F (37 C)] 98.6 F (37 C) (12/08 0714) Pulse Rate:  [64] 64 (12/08 0714) Resp:  [14] 14 (12/08 0714) BP: (131)/(88) 131/88 (12/08 0714) SpO2:  [99 %] 99 % (12/08 0714) Weight:  [  83 kg] 83 kg (12/08 0714)   General:   Alert,  Well-developed, well-nourished, pleasant and cooperative in NAD Head:  Normocephalic and atraumatic. Eyes:  Sclera clear, no icterus.   Conjunctiva pink. Ears:  Normal auditory acuity. Nose:  No deformity, discharge,  or lesions. Msk:  Symmetrical without gross deformities. Normal posture. Extremities:  Without clubbing or edema. Neurologic:  Alert and  oriented x4;  grossly normal neurologically. Skin:  Intact without significant lesions or rashes. Psych:  Alert and cooperative. Normal mood and affect.  Impression/Plan: Karen Huynh is here for a colonoscopy to be performed for surveillance purposes, personal history of adenomatous colon polyps in 2020  The risks of the procedure including infection, bleed, or perforation as well as benefits, limitations, alternatives and imponderables have been reviewed with the patient. Questions have been answered. All parties agreeable.

## 2024-08-05 NOTE — Transfer of Care (Signed)
 Immediate Anesthesia Transfer of Care Note  Patient: Karen Huynh  Procedure(s) Performed: COLONOSCOPY POLYPECTOMY, INTESTINE  Patient Location: Short Stay  Anesthesia Type:General  Level of Consciousness: awake  Airway & Oxygen Therapy: Patient Spontanous Breathing  Post-op Assessment: Report given to RN and Post -op Vital signs reviewed and stable  Post vital signs: Reviewed and stable  Last Vitals:  Vitals Value Taken Time  BP 100/57 08/05/24 08:28  Temp 36.5 C 08/05/24 08:28  Pulse 78 08/05/24 08:28  Resp 16 08/05/24 08:28  SpO2 98 % 08/05/24 08:28    Last Pain:  Vitals:   08/05/24 0828  TempSrc: Oral  PainSc:          Complications: No notable events documented.

## 2024-08-05 NOTE — Discharge Instructions (Addendum)
  Colonoscopy Discharge Instructions  Read the instructions outlined below and refer to this sheet in the next few weeks. These discharge instructions provide you with general information on caring for yourself after you leave the hospital. Your doctor may also give you specific instructions. While your treatment has been planned according to the most current medical practices available, unavoidable complications occasionally occur.   ACTIVITY You may resume your regular activity, but move at a slower pace for the next 24 hours.  Take frequent rest periods for the next 24 hours.  Walking will help get rid of the air and reduce the bloated feeling in your belly (abdomen).  No driving for 24 hours (because of the medicine (anesthesia) used during the test).   Do not sign any important legal documents or operate any machinery for 24 hours (because of the anesthesia used during the test).  NUTRITION Drink plenty of fluids.  You may resume your normal diet as instructed by your doctor.  Begin with a light meal and progress to your normal diet. Heavy or fried foods are harder to digest and may make you feel sick to your stomach (nauseated).  Avoid alcoholic beverages for 24 hours or as instructed.  MEDICATIONS You may resume your normal medications unless your doctor tells you otherwise.  WHAT YOU CAN EXPECT TODAY Some feelings of bloating in the abdomen.  Passage of more gas than usual.  Spotting of blood in your stool or on the toilet paper.  IF YOU HAD POLYPS REMOVED DURING THE COLONOSCOPY: No aspirin products for 7 days or as instructed.  No alcohol for 7 days or as instructed.  Eat a soft diet for the next 24 hours.  FINDING OUT THE RESULTS OF YOUR TEST Not all test results are available during your visit. If your test results are not back during the visit, make an appointment with your caregiver to find out the results. Do not assume everything is normal if you have not heard from your  caregiver or the medical facility. It is important for you to follow up on all of your test results.  SEEK IMMEDIATE MEDICAL ATTENTION IF: You have more than a spotting of blood in your stool.  Your belly is swollen (abdominal distention).  You are nauseated or vomiting.  You have a temperature over 101.  You have abdominal pain or discomfort that is severe or gets worse throughout the day.   Your colonoscopy revealed 4 polyp(s) which I removed successfully. 3 of these are likely benign, hyperplastic. Await pathology results, my office will contact you.   Given your age, I do not think you need further colonoscopies for polyp surveillance.   You also have diverticulosis and internal hemorrhoids. I would recommend increasing fiber in your diet or adding OTC Benefiber/Metamucil. Be sure to drink at least 4 to 6 glasses of water  daily.  Follow up in GI office in 3 months.   I hope you have a great rest of your week!  Carlin POUR. Cindie, D.O. Gastroenterology and Hepatology [facility]

## 2024-08-05 NOTE — Anesthesia Postprocedure Evaluation (Signed)
 Anesthesia Post Note  Patient: Karen Huynh  Procedure(s) Performed: COLONOSCOPY POLYPECTOMY, INTESTINE  Patient location during evaluation: Phase II Anesthesia Type: MAC Level of consciousness: awake Pain management: pain level controlled Vital Signs Assessment: post-procedure vital signs reviewed and stable Respiratory status: spontaneous breathing and respiratory function stable Cardiovascular status: blood pressure returned to baseline and stable Postop Assessment: no headache and no apparent nausea or vomiting Anesthetic complications: no Comments: Late entry   No notable events documented.   Last Vitals:  Vitals:   08/05/24 0714 08/05/24 0828  BP: 131/88 (!) 100/57  Pulse: 64 78  Resp: 14 16  Temp: 37 C 36.5 C  SpO2: 99% 98%    Last Pain:  Vitals:   08/05/24 0830  TempSrc:   PainSc: 0-No pain                 Yvonna JINNY Bosworth

## 2024-08-06 ENCOUNTER — Encounter (HOSPITAL_COMMUNITY): Payer: Self-pay | Admitting: Internal Medicine

## 2024-08-06 LAB — SURGICAL PATHOLOGY

## 2024-08-15 DIAGNOSIS — Z0000 Encounter for general adult medical examination without abnormal findings: Secondary | ICD-10-CM | POA: Diagnosis not present

## 2024-08-15 DIAGNOSIS — E785 Hyperlipidemia, unspecified: Secondary | ICD-10-CM | POA: Diagnosis not present

## 2024-08-15 DIAGNOSIS — D126 Benign neoplasm of colon, unspecified: Secondary | ICD-10-CM | POA: Diagnosis not present

## 2024-08-15 DIAGNOSIS — G8929 Other chronic pain: Secondary | ICD-10-CM | POA: Diagnosis not present

## 2024-08-15 DIAGNOSIS — D72819 Decreased white blood cell count, unspecified: Secondary | ICD-10-CM | POA: Diagnosis not present

## 2024-08-15 DIAGNOSIS — M25562 Pain in left knee: Secondary | ICD-10-CM | POA: Diagnosis not present

## 2024-08-15 DIAGNOSIS — M8580 Other specified disorders of bone density and structure, unspecified site: Secondary | ICD-10-CM | POA: Diagnosis not present

## 2024-08-15 DIAGNOSIS — I10 Essential (primary) hypertension: Secondary | ICD-10-CM | POA: Diagnosis not present

## 2024-08-15 DIAGNOSIS — E669 Obesity, unspecified: Secondary | ICD-10-CM | POA: Diagnosis not present

## 2024-08-15 DIAGNOSIS — M545 Low back pain, unspecified: Secondary | ICD-10-CM | POA: Diagnosis not present

## 2024-08-15 DIAGNOSIS — K5900 Constipation, unspecified: Secondary | ICD-10-CM | POA: Diagnosis not present

## 2024-08-15 DIAGNOSIS — R7303 Prediabetes: Secondary | ICD-10-CM | POA: Diagnosis not present

## 2024-09-03 ENCOUNTER — Other Ambulatory Visit (HOSPITAL_COMMUNITY): Payer: Self-pay | Admitting: Internal Medicine

## 2024-09-03 DIAGNOSIS — Z1231 Encounter for screening mammogram for malignant neoplasm of breast: Secondary | ICD-10-CM

## 2024-09-30 ENCOUNTER — Ambulatory Visit (HOSPITAL_COMMUNITY)

## 2024-10-04 ENCOUNTER — Inpatient Hospital Stay (HOSPITAL_COMMUNITY): Admission: RE | Admit: 2024-10-04

## 2024-10-04 ENCOUNTER — Encounter (HOSPITAL_COMMUNITY): Payer: Self-pay

## 2024-10-04 DIAGNOSIS — Z1231 Encounter for screening mammogram for malignant neoplasm of breast: Secondary | ICD-10-CM
